# Patient Record
Sex: Male | Born: 1985 | Race: Black or African American | Hispanic: No | Marital: Single | Smoking: Never smoker
Health system: Southern US, Community
[De-identification: ages and names within clinical notes are randomized; demographics above are authoritative.]

## PROBLEM LIST (undated history)

## (undated) DIAGNOSIS — S069X9A Unspecified intracranial injury with loss of consciousness of unspecified duration, initial encounter: Secondary | ICD-10-CM

---

## 2003-10-15 DIAGNOSIS — S069X9A Unspecified intracranial injury with loss of consciousness of unspecified duration, initial encounter: Secondary | ICD-10-CM

## 2003-10-15 HISTORY — DX: Unspecified intracranial injury with loss of consciousness of unspecified duration, initial encounter: S06.9X9A

## 2015-10-15 ENCOUNTER — Encounter (HOSPITAL_COMMUNITY): Payer: Self-pay

## 2015-10-15 ENCOUNTER — Encounter (HOSPITAL_COMMUNITY): Admission: EM | Disposition: A | Discharge status: Home / Self Care | Payer: Self-pay | Source: Home / Self Care

## 2015-10-15 ENCOUNTER — Emergency Department (HOSPITAL_COMMUNITY): Payer: No Typology Code available for payment source

## 2015-10-15 ENCOUNTER — Emergency Department (HOSPITAL_COMMUNITY): Payer: No Typology Code available for payment source | Admitting: Anesthesiology

## 2015-10-15 ENCOUNTER — Inpatient Hospital Stay (HOSPITAL_COMMUNITY)
Admission: EM | Admit: 2015-10-15 | Discharge: 2015-10-21 | DRG: 907 | Disposition: A | Discharge status: Home / Self Care | Payer: No Typology Code available for payment source | Attending: Surgery | Admitting: Surgery

## 2015-10-15 DIAGNOSIS — D62 Acute posthemorrhagic anemia: Secondary | ICD-10-CM | POA: Diagnosis not present

## 2015-10-15 DIAGNOSIS — S15012A Minor laceration of left carotid artery, initial encounter: Secondary | ICD-10-CM | POA: Diagnosis present

## 2015-10-15 DIAGNOSIS — E876 Hypokalemia: Secondary | ICD-10-CM | POA: Diagnosis not present

## 2015-10-15 DIAGNOSIS — R402362 Coma scale, best motor response, obeys commands, at arrival to emergency department: Secondary | ICD-10-CM | POA: Diagnosis present

## 2015-10-15 DIAGNOSIS — R402142 Coma scale, eyes open, spontaneous, at arrival to emergency department: Secondary | ICD-10-CM | POA: Diagnosis present

## 2015-10-15 DIAGNOSIS — J9690 Respiratory failure, unspecified, unspecified whether with hypoxia or hypercapnia: Secondary | ICD-10-CM | POA: Diagnosis present

## 2015-10-15 DIAGNOSIS — S159XXA Injury of unspecified blood vessel at neck level, initial encounter: Secondary | ICD-10-CM

## 2015-10-15 DIAGNOSIS — W3400XA Accidental discharge from unspecified firearms or gun, initial encounter: Secondary | ICD-10-CM

## 2015-10-15 DIAGNOSIS — R1310 Dysphagia, unspecified: Secondary | ICD-10-CM | POA: Diagnosis not present

## 2015-10-15 DIAGNOSIS — S199XXA Unspecified injury of neck, initial encounter: Secondary | ICD-10-CM | POA: Diagnosis present

## 2015-10-15 DIAGNOSIS — Z22322 Carrier or suspected carrier of Methicillin resistant Staphylococcus aureus: Secondary | ICD-10-CM

## 2015-10-15 DIAGNOSIS — R571 Hypovolemic shock: Secondary | ICD-10-CM | POA: Diagnosis present

## 2015-10-15 DIAGNOSIS — S21119A Laceration without foreign body of unspecified front wall of thorax without penetration into thoracic cavity, initial encounter: Secondary | ICD-10-CM | POA: Diagnosis present

## 2015-10-15 DIAGNOSIS — K229 Disease of esophagus, unspecified: Secondary | ICD-10-CM

## 2015-10-15 DIAGNOSIS — S158XXA Injury of other specified blood vessels at neck level, initial encounter: Secondary | ICD-10-CM | POA: Diagnosis present

## 2015-10-15 DIAGNOSIS — Z978 Presence of other specified devices: Secondary | ICD-10-CM

## 2015-10-15 DIAGNOSIS — R402242 Coma scale, best verbal response, confused conversation, at arrival to emergency department: Secondary | ICD-10-CM | POA: Diagnosis present

## 2015-10-15 DIAGNOSIS — S15002A Unspecified injury of left carotid artery, initial encounter: Secondary | ICD-10-CM

## 2015-10-15 DIAGNOSIS — S1191XA Laceration without foreign body of unspecified part of neck, initial encounter: Secondary | ICD-10-CM

## 2015-10-15 DIAGNOSIS — Z419 Encounter for procedure for purposes other than remedying health state, unspecified: Secondary | ICD-10-CM

## 2015-10-15 DIAGNOSIS — R0602 Shortness of breath: Secondary | ICD-10-CM

## 2015-10-15 HISTORY — PX: STERNOTOMY: SHX1057

## 2015-10-15 HISTORY — PX: CHEST EXPLORATION: SHX5104

## 2015-10-15 HISTORY — PX: THYROID EXPLORATION: SHX5280

## 2015-10-15 HISTORY — DX: Unspecified intracranial injury with loss of consciousness of unspecified duration, initial encounter: S06.9X9A

## 2015-10-15 LAB — URINALYSIS, ROUTINE W REFLEX MICROSCOPIC
Bilirubin Urine: NEGATIVE
Glucose, UA: 100 mg/dL — AB
Ketones, ur: 15 mg/dL — AB
Nitrite: NEGATIVE
Protein, ur: >300 mg/dL — AB
Specific Gravity, Urine: 1.029 (ref 1.005–1.030)
pH: 6 (ref 5.0–8.0)

## 2015-10-15 LAB — POCT I-STAT 7, (LYTES, BLD GAS, ICA,H+H)
ACID-BASE DEFICIT: 8 mmol/L — AB (ref 0.0–2.0)
ACID-BASE DEFICIT: 4 mmol/L — AB (ref 0.0–2.0)
Acid-base deficit: 14 mmol/L — ABNORMAL HIGH (ref 0.0–2.0)
Acid-base deficit: 2 mmol/L (ref 0.0–2.0)
BICARBONATE: 21.9 meq/L (ref 20.0–24.0)
BICARBONATE: 23.5 meq/L (ref 20.0–24.0)
Bicarbonate: 15.1 mEq/L — ABNORMAL LOW (ref 20.0–24.0)
Bicarbonate: 18.7 mEq/L — ABNORMAL LOW (ref 20.0–24.0)
Bicarbonate: 24.5 mEq/L — ABNORMAL HIGH (ref 20.0–24.0)
CALCIUM ION: 0.97 mmol/L — AB (ref 1.12–1.23)
CALCIUM ION: 0.71 mmol/L — AB (ref 1.12–1.23)
CALCIUM ION: 1.02 mmol/L — AB (ref 1.12–1.23)
Calcium, Ion: 0.58 mmol/L — CL (ref 1.12–1.23)
Calcium, Ion: 0.96 mmol/L — ABNORMAL LOW (ref 1.12–1.23)
HCT: 27 % — ABNORMAL LOW (ref 39.0–52.0)
HCT: 33 % — ABNORMAL LOW (ref 39.0–52.0)
HCT: 25 % — ABNORMAL LOW (ref 39.0–52.0)
HCT: 27 % — ABNORMAL LOW (ref 39.0–52.0)
HEMATOCRIT: 25 % — AB (ref 39.0–52.0)
HEMOGLOBIN: 11.2 g/dL — AB (ref 13.0–17.0)
HEMOGLOBIN: 9.2 g/dL — AB (ref 13.0–17.0)
HEMOGLOBIN: 8.5 g/dL — AB (ref 13.0–17.0)
Hemoglobin: 8.5 g/dL — ABNORMAL LOW (ref 13.0–17.0)
Hemoglobin: 9.2 g/dL — ABNORMAL LOW (ref 13.0–17.0)
O2 SAT: 100 %
O2 SAT: 100 %
O2 SAT: 100 %
O2 Saturation: 100 %
O2 Saturation: 100 %
PCO2 ART: 44.2 mmHg (ref 35.0–45.0)
PCO2 ART: 42.5 mmHg (ref 35.0–45.0)
PCO2 ART: 38.4 mmHg (ref 35.0–45.0)
PH ART: 7.244 — AB (ref 7.350–7.450)
PH ART: 7.317 — AB (ref 7.350–7.450)
PO2 ART: 552 mmHg — AB (ref 80.0–100.0)
PO2 ART: 557 mmHg — AB (ref 80.0–100.0)
PO2 ART: 527 mmHg — AB (ref 80.0–100.0)
POTASSIUM: 4.9 mmol/L (ref 3.5–5.1)
POTASSIUM: 4.6 mmol/L (ref 3.5–5.1)
Patient temperature: 36.1
Potassium: 5 mmol/L (ref 3.5–5.1)
Potassium: 4.9 mmol/L (ref 3.5–5.1)
Potassium: 3.5 mmol/L (ref 3.5–5.1)
SODIUM: 144 mmol/L (ref 135–145)
SODIUM: 146 mmol/L — AB (ref 135–145)
Sodium: 144 mmol/L (ref 135–145)
Sodium: 145 mmol/L (ref 135–145)
Sodium: 145 mmol/L (ref 135–145)
TCO2: 16 mmol/L (ref 0–100)
TCO2: 20 mmol/L (ref 0–100)
TCO2: 23 mmol/L (ref 0–100)
TCO2: 25 mmol/L (ref 0–100)
TCO2: 26 mmol/L (ref 0–100)
pCO2 arterial: 42.7 mmHg (ref 35.0–45.0)
pCO2 arterial: 42.2 mmHg (ref 35.0–45.0)
pH, Arterial: 7.137 — CL (ref 7.350–7.450)
pH, Arterial: 7.348 — ABNORMAL LOW (ref 7.350–7.450)
pH, Arterial: 7.412 (ref 7.350–7.450)
pO2, Arterial: 553 mmHg — ABNORMAL HIGH (ref 80.0–100.0)
pO2, Arterial: 464 mmHg — ABNORMAL HIGH (ref 80.0–100.0)

## 2015-10-15 LAB — BASIC METABOLIC PANEL
Anion gap: 5 (ref 5–15)
BUN: 10 mg/dL (ref 6–20)
CO2: 25 mmol/L (ref 22–32)
Calcium: 8.1 mg/dL — ABNORMAL LOW (ref 8.9–10.3)
Chloride: 115 mmol/L — ABNORMAL HIGH (ref 101–111)
Creatinine, Ser: 1.13 mg/dL (ref 0.61–1.24)
GFR calc Af Amer: >60 mL/min (ref >60)
GFR calc non Af Amer: >60 mL/min (ref >60)
Glucose, Bld: 134 mg/dL — ABNORMAL HIGH (ref 65–99)
Potassium: 3.7 mmol/L (ref 3.5–5.1)
Sodium: 145 mmol/L (ref 135–145)

## 2015-10-15 LAB — DIC (DISSEMINATED INTRAVASCULAR COAGULATION)PANEL
D-Dimer, Quant: 7.78 ug/mL-FEU — ABNORMAL HIGH (ref 0.00–0.50)
Fibrinogen: 180 mg/dL — ABNORMAL LOW (ref 204–475)
INR: 1.34 (ref 0.00–1.49)
Platelets: 89 10*3/uL — ABNORMAL LOW (ref 150–400)
Prothrombin Time: 16.7 seconds — ABNORMAL HIGH (ref 11.6–15.2)
aPTT: 34 seconds (ref 24–37)

## 2015-10-15 LAB — CBC
HCT: 33.4 % — ABNORMAL LOW (ref 39.0–52.0)
HCT: 31.8 % — ABNORMAL LOW (ref 39.0–52.0)
HCT: 30.5 % — ABNORMAL LOW (ref 39.0–52.0)
HEMATOCRIT: 29.8 % — AB (ref 39.0–52.0)
HEMOGLOBIN: 10.4 g/dL — AB (ref 13.0–17.0)
Hemoglobin: 11.2 g/dL — ABNORMAL LOW (ref 13.0–17.0)
Hemoglobin: 11.7 g/dL — ABNORMAL LOW (ref 13.0–17.0)
Hemoglobin: 11.1 g/dL — ABNORMAL LOW (ref 13.0–17.0)
MCH: 31.5 pg (ref 26.0–34.0)
MCH: 30.5 pg (ref 26.0–34.0)
MCH: 30.9 pg (ref 26.0–34.0)
MCH: 30.6 pg (ref 26.0–34.0)
MCHC: 33.5 g/dL (ref 30.0–36.0)
MCHC: 34.9 g/dL (ref 30.0–36.0)
MCHC: 36.8 g/dL — ABNORMAL HIGH (ref 30.0–36.0)
MCHC: 36.4 g/dL — ABNORMAL HIGH (ref 30.0–36.0)
MCV: 94.1 fL (ref 78.0–100.0)
MCV: 83.9 fL (ref 78.0–100.0)
MCV: 84 fL (ref 78.0–100.0)
PLATELETS: 258 10*3/uL (ref 150–400)
Platelets: 61 10*3/uL — ABNORMAL LOW (ref 150–400)
Platelets: 92 10*3/uL — ABNORMAL LOW (ref 150–400)
Platelets: 71 10*3/uL — ABNORMAL LOW (ref 150–400)
RBC: 3.55 MIL/uL — ABNORMAL LOW (ref 4.22–5.81)
RBC: 3.41 MIL/uL — AB (ref 4.22–5.81)
RBC: 3.79 MIL/uL — ABNORMAL LOW (ref 4.22–5.81)
RBC: 3.63 MIL/uL — ABNORMAL LOW (ref 4.22–5.81)
RDW: 13.5 % (ref 11.5–15.5)
RDW: 14.9 % (ref 11.5–15.5)
RDW: 14.8 % (ref 11.5–15.5)
RDW: 15.3 % (ref 11.5–15.5)
WBC: 14.9 10*3/uL — ABNORMAL HIGH (ref 4.0–10.5)
WBC: 7.8 10*3/uL (ref 4.0–10.5)
WBC: 6.6 10*3/uL (ref 4.0–10.5)
WBC: 7.6 10*3/uL (ref 4.0–10.5)

## 2015-10-15 LAB — POCT I-STAT 3, ART BLOOD GAS (G3+)
Acid-Base Excess: 1 mmol/L (ref 0.0–2.0)
Acid-Base Excess: 2 mmol/L (ref 0.0–2.0)
BICARBONATE: 26.2 meq/L — AB (ref 20.0–24.0)
BICARBONATE: 26.4 meq/L — AB (ref 20.0–24.0)
O2 Saturation: 100 %
O2 Saturation: 99 %
PCO2 ART: 41.8 mmHg (ref 35.0–45.0)
PH ART: 7.419 (ref 7.350–7.450)
PH ART: 7.409 (ref 7.350–7.450)
PO2 ART: 167 mmHg — AB (ref 80.0–100.0)
TCO2: 27 mmol/L (ref 0–100)
TCO2: 28 mmol/L (ref 0–100)
pCO2 arterial: 40.1 mmHg (ref 35.0–45.0)
pO2, Arterial: 122 mmHg — ABNORMAL HIGH (ref 80.0–100.0)

## 2015-10-15 LAB — COMPREHENSIVE METABOLIC PANEL
ALT: 10 U/L — AB (ref 17–63)
ALT: 18 U/L (ref 17–63)
AST: 18 U/L (ref 15–41)
AST: 24 U/L (ref 15–41)
Albumin: 3.1 g/dL — ABNORMAL LOW (ref 3.5–5.0)
Albumin: 2.2 g/dL — ABNORMAL LOW (ref 3.5–5.0)
Alkaline Phosphatase: 41 U/L (ref 38–126)
Alkaline Phosphatase: 35 U/L — ABNORMAL LOW (ref 38–126)
Anion gap: 15 (ref 5–15)
Anion gap: 5 (ref 5–15)
BUN: 10 mg/dL (ref 6–20)
BUN: 7 mg/dL (ref 6–20)
CHLORIDE: 111 mmol/L (ref 101–111)
CO2: 17 mmol/L — AB (ref 22–32)
CO2: 24 mmol/L (ref 22–32)
CREATININE: 1.89 mg/dL — AB (ref 0.61–1.24)
Calcium: 7.8 mg/dL — ABNORMAL LOW (ref 8.9–10.3)
Calcium: 7.6 mg/dL — ABNORMAL LOW (ref 8.9–10.3)
Chloride: 115 mmol/L — ABNORMAL HIGH (ref 101–111)
Creatinine, Ser: 1.03 mg/dL (ref 0.61–1.24)
GFR calc Af Amer: 53 mL/min — ABNORMAL LOW (ref >60)
GFR calc Af Amer: >60 mL/min (ref >60)
GFR calc non Af Amer: >60 mL/min (ref >60)
GFR, EST NON AFRICAN AMERICAN: 46 mL/min — AB (ref >60)
GLUCOSE: 207 mg/dL — AB (ref 65–99)
Glucose, Bld: 146 mg/dL — ABNORMAL HIGH (ref 65–99)
Potassium: 3.5 mmol/L (ref 3.5–5.1)
Potassium: 3.3 mmol/L — ABNORMAL LOW (ref 3.5–5.1)
Sodium: 143 mmol/L (ref 135–145)
Sodium: 144 mmol/L (ref 135–145)
Total Bilirubin: 0.3 mg/dL (ref 0.3–1.2)
Total Bilirubin: 1.5 mg/dL — ABNORMAL HIGH (ref 0.3–1.2)
Total Protein: 5.2 g/dL — ABNORMAL LOW (ref 6.5–8.1)
Total Protein: 4 g/dL — ABNORMAL LOW (ref 6.5–8.1)

## 2015-10-15 LAB — ETHANOL: Alcohol, Ethyl (B): 190 mg/dL — ABNORMAL HIGH (ref <5)

## 2015-10-15 LAB — PROTIME-INR
INR: 1.14 (ref 0.00–1.49)
INR: 1.39 (ref 0.00–1.49)
PROTHROMBIN TIME: 17.1 s — AB (ref 11.6–15.2)
Prothrombin Time: 14.8 seconds (ref 11.6–15.2)

## 2015-10-15 LAB — URINE MICROSCOPIC-ADD ON
Bacteria, UA: NONE SEEN
Squamous Epithelial / LPF: NONE SEEN

## 2015-10-15 LAB — APTT: aPTT: 32 seconds (ref 24–37)

## 2015-10-15 LAB — MRSA PCR SCREENING: MRSA by PCR: POSITIVE — AB

## 2015-10-15 LAB — ABO/RH: ABO/RH(D): A POS

## 2015-10-15 LAB — CDS SEROLOGY

## 2015-10-15 LAB — FIBRINOGEN: FIBRINOGEN: 211 mg/dL (ref 204–475)

## 2015-10-15 SURGERY — EXPLORATION, THYROID
Anesthesia: General

## 2015-10-15 MED ORDER — PHENYLEPHRINE HCL 10 MG/ML IJ SOLN
10.0000 mg | INTRAVENOUS | Status: DC | PRN
  Administered 2015-10-15: 10 ug/min via INTRAVENOUS

## 2015-10-15 MED ORDER — SODIUM CHLORIDE 0.9 % IV SOLN
INTRAVENOUS | Status: DC | PRN
  Administered 2015-10-15 (×9): via INTRAVENOUS

## 2015-10-15 MED ORDER — DEXMEDETOMIDINE HCL IN NACL 200 MCG/50ML IV SOLN: 0.7000 ug/kg/h | INTRAVENOUS | Status: DC

## 2015-10-15 MED ORDER — ROCURONIUM BROMIDE 50 MG/5ML IV SOLN
0.6000 mg/kg | Freq: Once | INTRAVENOUS | Status: DC
  Filled 2015-10-15: qty 5.47

## 2015-10-15 MED ORDER — MIDAZOLAM HCL 2 MG/2ML IJ SOLN
2.0000 mg | INTRAMUSCULAR | Status: AC | PRN
  Administered 2015-10-15 – 2015-10-16 (×3): 2 mg via INTRAVENOUS
  Filled 2015-10-15 (×3): qty 2

## 2015-10-15 MED ORDER — ETOMIDATE 2 MG/ML IV SOLN
INTRAVENOUS | Status: AC | PRN
  Administered 2015-10-15: 20 mg via INTRAVENOUS

## 2015-10-15 MED ORDER — SODIUM CHLORIDE 0.9 % IV SOLN
1.0000 g/h | INTRAVENOUS | Status: DC
  Administered 2015-10-15: 1 g/h via INTRAVENOUS
  Administered 2015-10-15: 5 g/h via INTRAVENOUS
  Filled 2015-10-15 (×2): qty 20

## 2015-10-15 MED ORDER — MIDAZOLAM HCL 2 MG/2ML IJ SOLN
2.0000 mg | INTRAMUSCULAR | Status: DC | PRN
  Administered 2015-10-16 – 2015-10-17 (×2): 2 mg via INTRAVENOUS
  Filled 2015-10-15 (×3): qty 2

## 2015-10-15 MED ORDER — MIDAZOLAM HCL 2 MG/2ML IJ SOLN
INTRAMUSCULAR | Status: AC
  Filled 2015-10-15: qty 2

## 2015-10-15 MED ORDER — BISACODYL 5 MG PO TBEC
10.0000 mg | DELAYED_RELEASE_TABLET | Freq: Every day | ORAL | Status: DC
  Administered 2015-10-17: 10 mg via ORAL
  Filled 2015-10-15 (×3): qty 2

## 2015-10-15 MED ORDER — NITROGLYCERIN IN D5W 200-5 MCG/ML-% IV SOLN
0.0000 ug/min | INTRAVENOUS | Status: DC
  Administered 2015-10-15: 50 ug/min via INTRAVENOUS

## 2015-10-15 MED ORDER — NITROGLYCERIN IN D5W 200-5 MCG/ML-% IV SOLN
INTRAVENOUS | Status: AC
  Filled 2015-10-15: qty 250

## 2015-10-15 MED ORDER — MIDAZOLAM HCL 5 MG/5ML IJ SOLN
INTRAMUSCULAR | Status: DC | PRN
  Administered 2015-10-15: 2 mg via INTRAVENOUS
  Administered 2015-10-15: 4 mg via INTRAVENOUS
  Administered 2015-10-15: 2 mg via INTRAVENOUS

## 2015-10-15 MED ORDER — ROCURONIUM BROMIDE 50 MG/5ML IV SOLN
INTRAVENOUS | Status: AC | PRN
  Administered 2015-10-15: 10 mg via INTRAVENOUS

## 2015-10-15 MED ORDER — MIDAZOLAM HCL 5 MG/5ML IJ SOLN
INTRAMUSCULAR | Status: AC | PRN
  Administered 2015-10-15: 4 mg via INTRAVENOUS

## 2015-10-15 MED ORDER — SODIUM CHLORIDE 0.9 % IV SOLN: 1000.0000 mL | Freq: Once | INTRAVENOUS | Status: DC

## 2015-10-15 MED ORDER — CHLORHEXIDINE GLUCONATE 0.12% ORAL RINSE (MEDLINE KIT)
15.0000 mL | Freq: Two times a day (BID) | OROMUCOSAL | Status: DC
  Administered 2015-10-15 – 2015-10-17 (×5): 15 mL via OROMUCOSAL

## 2015-10-15 MED ORDER — HYDRALAZINE HCL 20 MG/ML IJ SOLN
10.0000 mg | INTRAMUSCULAR | Status: DC | PRN
  Administered 2015-10-15 – 2015-10-16 (×2): 10 mg via INTRAVENOUS
  Filled 2015-10-15 (×2): qty 1

## 2015-10-15 MED ORDER — CHLORHEXIDINE GLUCONATE CLOTH 2 % EX PADS
6.0000 | MEDICATED_PAD | Freq: Every day | CUTANEOUS | Status: AC
  Administered 2015-10-15 – 2015-10-19 (×5): 6 via TOPICAL

## 2015-10-15 MED ORDER — FENTANYL CITRATE (PF) 250 MCG/5ML IJ SOLN
INTRAMUSCULAR | Status: AC
  Filled 2015-10-15: qty 5

## 2015-10-15 MED ORDER — ALBUTEROL SULFATE (2.5 MG/3ML) 0.083% IN NEBU
2.5000 mg | INHALATION_SOLUTION | Freq: Four times a day (QID) | RESPIRATORY_TRACT | Status: DC
  Administered 2015-10-15 – 2015-10-17 (×9): 2.5 mg via RESPIRATORY_TRACT
  Filled 2015-10-15 (×17): qty 3

## 2015-10-15 MED ORDER — METOCLOPRAMIDE HCL 5 MG/ML IJ SOLN
10.0000 mg | Freq: Four times a day (QID) | INTRAMUSCULAR | Status: AC
  Administered 2015-10-15 – 2015-10-20 (×19): 10 mg via INTRAVENOUS
  Filled 2015-10-15 (×19): qty 2

## 2015-10-15 MED ORDER — ACETAMINOPHEN 500 MG PO TABS
1000.0000 mg | ORAL_TABLET | Freq: Four times a day (QID) | ORAL | Status: AC
  Administered 2015-10-16 – 2015-10-19 (×12): 1000 mg via ORAL
  Filled 2015-10-15 (×14): qty 2

## 2015-10-15 MED ORDER — VANCOMYCIN HCL 1000 MG IV SOLR
INTRAVENOUS | Status: AC
  Filled 2015-10-15: qty 1000

## 2015-10-15 MED ORDER — FENTANYL CITRATE (PF) 100 MCG/2ML IJ SOLN
50.0000 ug | Freq: Once | INTRAMUSCULAR | Status: AC
  Administered 2015-10-15: 50 ug via INTRAVENOUS

## 2015-10-15 MED ORDER — DEXMEDETOMIDINE HCL IN NACL 200 MCG/50ML IV SOLN
0.0000 ug/kg/h | INTRAVENOUS | Status: DC
  Administered 2015-10-15 (×2): 0.7 ug/kg/h via INTRAVENOUS
  Filled 2015-10-15: qty 50

## 2015-10-15 MED ORDER — DEXMEDETOMIDINE HCL IN NACL 400 MCG/100ML IV SOLN
0.4000 ug/kg/h | INTRAVENOUS | Status: DC
  Administered 2015-10-15 – 2015-10-16 (×2): 0.7 ug/kg/h via INTRAVENOUS
  Filled 2015-10-15 (×3): qty 100

## 2015-10-15 MED ORDER — ONDANSETRON HCL 4 MG/2ML IJ SOLN
4.0000 mg | Freq: Four times a day (QID) | INTRAMUSCULAR | Status: DC | PRN
  Administered 2015-10-17: 4 mg via INTRAVENOUS
  Filled 2015-10-15: qty 2

## 2015-10-15 MED ORDER — SODIUM CHLORIDE 0.9 % IV SOLN
1000.0000 mL | INTRAVENOUS | Status: DC
Start: 2015-10-15 — End: 2015-10-15

## 2015-10-15 MED ORDER — FENTANYL CITRATE (PF) 100 MCG/2ML IJ SOLN
25.0000 ug | INTRAMUSCULAR | Status: DC | PRN
  Administered 2015-10-16: 25 ug via INTRAVENOUS
  Administered 2015-10-16 – 2015-10-19 (×13): 50 ug via INTRAVENOUS
  Filled 2015-10-15 (×14): qty 2

## 2015-10-15 MED ORDER — DEXMEDETOMIDINE HCL IN NACL 200 MCG/50ML IV SOLN
0.0000 ug/kg/h | INTRAVENOUS | Status: DC
  Administered 2015-10-15: 0.7 ug/kg/h via INTRAVENOUS

## 2015-10-15 MED ORDER — CALCIUM CHLORIDE 10 % IV SOLN
INTRAVENOUS | Status: DC | PRN
  Administered 2015-10-15: 250 mg via INTRAVENOUS
  Administered 2015-10-15: 1000 mg via INTRAVENOUS
  Administered 2015-10-15 (×3): 250 mg via INTRAVENOUS
  Administered 2015-10-15: 500 mg via INTRAVENOUS
  Administered 2015-10-15: 250 mg via INTRAVENOUS

## 2015-10-15 MED ORDER — FENTANYL BOLUS VIA INFUSION
50.0000 ug | INTRAVENOUS | Status: DC | PRN
  Filled 2015-10-15: qty 50

## 2015-10-15 MED ORDER — SUCCINYLCHOLINE CHLORIDE 20 MG/ML IJ SOLN
INTRAMUSCULAR | Status: AC | PRN
  Administered 2015-10-15: 100 mg via INTRAVENOUS

## 2015-10-15 MED ORDER — PHENYLEPHRINE HCL 10 MG/ML IJ SOLN
100.0000 ug/min | INTRAVENOUS | Status: DC
  Filled 2015-10-15: qty 1

## 2015-10-15 MED ORDER — POTASSIUM CHLORIDE 10 MEQ/50ML IV SOLN
10.0000 meq | INTRAVENOUS | Status: AC
  Administered 2015-10-15 (×2): 10 meq via INTRAVENOUS

## 2015-10-15 MED ORDER — DEXTROSE 5 % IV SOLN
1.5000 g | Freq: Two times a day (BID) | INTRAVENOUS | Status: AC
  Administered 2015-10-15 – 2015-10-17 (×4): 1.5 g via INTRAVENOUS
  Filled 2015-10-15 (×4): qty 1.5

## 2015-10-15 MED ORDER — CEFAZOLIN SODIUM-DEXTROSE 2-3 GM-% IV SOLR
INTRAVENOUS | Status: DC | PRN
  Administered 2015-10-15 (×2): 2 g via INTRAVENOUS

## 2015-10-15 MED ORDER — SODIUM BICARBONATE 8.4 % IV SOLN
INTRAVENOUS | Status: DC | PRN
  Administered 2015-10-15 (×2): 50 meq via INTRAVENOUS

## 2015-10-15 MED ORDER — SODIUM CHLORIDE 0.9 % IV SOLN: INTRAVENOUS | Status: DC | PRN

## 2015-10-15 MED ORDER — SODIUM CHLORIDE 0.9 % IV SOLN
INTRAVENOUS | Status: AC | PRN
  Administered 2015-10-15 (×2): 1000 mL via INTRAVENOUS

## 2015-10-15 MED ORDER — HEMOSTATIC AGENTS (NO CHARGE) OPTIME
TOPICAL | Status: DC | PRN
  Administered 2015-10-15 (×2): 1 via TOPICAL

## 2015-10-15 MED ORDER — POTASSIUM CHLORIDE 10 MEQ/50ML IV SOLN
10.0000 meq | Freq: Every day | INTRAVENOUS | Status: DC | PRN
  Administered 2015-10-16 – 2015-10-19 (×11): 10 meq via INTRAVENOUS
  Filled 2015-10-15 (×10): qty 50

## 2015-10-15 MED ORDER — LACTATED RINGERS IV SOLN
INTRAVENOUS | Status: DC | PRN
  Administered 2015-10-15: 03:00:00 via INTRAVENOUS

## 2015-10-15 MED ORDER — SENNOSIDES-DOCUSATE SODIUM 8.6-50 MG PO TABS
1.0000 | ORAL_TABLET | Freq: Every day | ORAL | Status: DC
  Administered 2015-10-15 – 2015-10-20 (×5): 1 via ORAL
  Filled 2015-10-15 (×5): qty 1

## 2015-10-15 MED ORDER — MUPIROCIN 2 % EX OINT
1.0000 "application " | TOPICAL_OINTMENT | Freq: Two times a day (BID) | CUTANEOUS | Status: AC
  Administered 2015-10-15 – 2015-10-19 (×10): 1 via NASAL
  Filled 2015-10-15: qty 22

## 2015-10-15 MED ORDER — SODIUM CHLORIDE 0.9 % IV SOLN
25.0000 ug/h | INTRAVENOUS | Status: DC
  Administered 2015-10-15: 50 ug/h via INTRAVENOUS
  Administered 2015-10-16: 20 ug/h via INTRAVENOUS
  Filled 2015-10-15 (×2): qty 50

## 2015-10-15 MED ORDER — DEXMEDETOMIDINE HCL IN NACL 200 MCG/50ML IV SOLN
INTRAVENOUS | Status: DC | PRN
  Administered 2015-10-15: 0.7 ug/kg/h via INTRAVENOUS

## 2015-10-15 MED ORDER — SODIUM CHLORIDE 0.9 % IV SOLN
20.0000 ug | Freq: Once | INTRAVENOUS | Status: AC
  Administered 2015-10-15: 20 ug via INTRAVENOUS
  Filled 2015-10-15: qty 5

## 2015-10-15 MED ORDER — MIDAZOLAM HCL 2 MG/2ML IJ SOLN
INTRAMUSCULAR | Status: AC
  Filled 2015-10-15: qty 4

## 2015-10-15 MED ORDER — VANCOMYCIN HCL 1000 MG IV SOLR
INTRAVENOUS | Status: DC | PRN
  Administered 2015-10-15: 1000 mg

## 2015-10-15 MED ORDER — FENTANYL CITRATE (PF) 250 MCG/5ML IJ SOLN
INTRAMUSCULAR | Status: DC | PRN
  Administered 2015-10-15: 50 ug via INTRAVENOUS
  Administered 2015-10-15: 500 ug via INTRAVENOUS
  Administered 2015-10-15 (×2): 100 ug via INTRAVENOUS
  Administered 2015-10-15: 50 ug via INTRAVENOUS
  Administered 2015-10-15 (×2): 100 ug via INTRAVENOUS

## 2015-10-15 MED ORDER — SODIUM CHLORIDE 0.9 % IJ SOLN
OROMUCOSAL | Status: DC | PRN
  Administered 2015-10-15: 4 mL via TOPICAL

## 2015-10-15 MED ORDER — VANCOMYCIN HCL IN DEXTROSE 1-5 GM/200ML-% IV SOLN
1000.0000 mg | Freq: Two times a day (BID) | INTRAVENOUS | Status: AC
  Administered 2015-10-15 – 2015-10-16 (×3): 1000 mg via INTRAVENOUS
  Filled 2015-10-15 (×3): qty 200

## 2015-10-15 MED ORDER — ANTISEPTIC ORAL RINSE SOLUTION (CORINZ)
7.0000 mL | Freq: Four times a day (QID) | OROMUCOSAL | Status: DC
  Administered 2015-10-15 – 2015-10-17 (×6): 7 mL via OROMUCOSAL

## 2015-10-15 MED ORDER — POTASSIUM CHLORIDE 10 MEQ/50ML IV SOLN
10.0000 meq | INTRAVENOUS | Status: AC
  Administered 2015-10-15 (×2): 10 meq via INTRAVENOUS
  Filled 2015-10-15: qty 50

## 2015-10-15 MED ORDER — DEXTROSE-NACL 5-0.9 % IV SOLN
INTRAVENOUS | Status: DC
  Administered 2015-10-15: 100 mL/h via INTRAVENOUS
  Administered 2015-10-15: 09:00:00 via INTRAVENOUS

## 2015-10-15 MED ORDER — ROCURONIUM BROMIDE 50 MG/5ML IV SOLN
INTRAVENOUS | Status: AC
  Filled 2015-10-15: qty 1

## 2015-10-15 MED ORDER — ACETAMINOPHEN 160 MG/5ML PO SOLN
1000.0000 mg | Freq: Four times a day (QID) | ORAL | Status: AC
  Administered 2015-10-15 – 2015-10-16 (×4): 1000 mg via ORAL
  Filled 2015-10-15 (×4): qty 40.6

## 2015-10-15 MED ORDER — ROCURONIUM BROMIDE 100 MG/10ML IV SOLN
INTRAVENOUS | Status: DC | PRN
  Administered 2015-10-15: 50 mg via INTRAVENOUS
  Administered 2015-10-15: 100 mg via INTRAVENOUS
  Administered 2015-10-15 (×2): 50 mg via INTRAVENOUS

## 2015-10-15 SURGICAL SUPPLY — 60 items
BLADE STERNUM SYSTEM 6 (BLADE) ×3 IMPLANT
BLADE SURG ROTATE 9660 (MISCELLANEOUS) ×6 IMPLANT
CANISTER SUCTION 2500CC (MISCELLANEOUS) ×6 IMPLANT
CLIP TI MEDIUM 24 (CLIP) ×6 IMPLANT
CLIP TI WIDE RED SMALL 24 (CLIP) ×3 IMPLANT
CONNECTOR 5 IN 1 STRAIGHT STRL (MISCELLANEOUS) ×3 IMPLANT
COVER BACK TABLE 60X90IN (DRAPES) ×3 IMPLANT
COVER SURGICAL LIGHT HANDLE (MISCELLANEOUS) ×3 IMPLANT
DRAIN CHANNEL 15F RND FF W/TCR (WOUND CARE) ×3 IMPLANT
DRAIN CHANNEL 32F RND 10.7 FF (WOUND CARE) ×3 IMPLANT
DRSG AQUACEL AG ADV 3.5X14 (GAUZE/BANDAGES/DRESSINGS) ×3 IMPLANT
DRSG COVADERM 4X6 (GAUZE/BANDAGES/DRESSINGS) ×3 IMPLANT
ELECT BLADE 4.0 EZ CLEAN MEGAD (MISCELLANEOUS) ×3
ELECTRODE BLDE 4.0 EZ CLN MEGD (MISCELLANEOUS) ×2 IMPLANT
EVACUATOR SILICONE 100CC (DRAIN) ×3 IMPLANT
GAUZE SPONGE 4X4 12PLY STRL (GAUZE/BANDAGES/DRESSINGS) ×3 IMPLANT
GAUZE XEROFORM 5X9 LF (GAUZE/BANDAGES/DRESSINGS) ×3 IMPLANT
GEL ULTRASOUND 20GR AQUASONIC (MISCELLANEOUS) ×3 IMPLANT
GLOVE BIO SURGEON STRL SZ7 (GLOVE) ×3 IMPLANT
GLOVE BIO SURGEON STRL SZ7.5 (GLOVE) ×9 IMPLANT
GOWN STRL REUS W/ TWL LRG LVL3 (GOWN DISPOSABLE) ×8 IMPLANT
GOWN STRL REUS W/ TWL XL LVL3 (GOWN DISPOSABLE) ×6 IMPLANT
GOWN STRL REUS W/TWL LRG LVL3 (GOWN DISPOSABLE) ×4
GOWN STRL REUS W/TWL XL LVL3 (GOWN DISPOSABLE) ×3
HEMOSTAT SURGICEL 2X14 (HEMOSTASIS) ×3 IMPLANT
KIT BASIN OR (CUSTOM PROCEDURE TRAY) ×3 IMPLANT
KIT ROOM TURNOVER OR (KITS) ×3 IMPLANT
NEEDLE 18GX1X1/2 (RX/OR ONLY) (NEEDLE) ×3 IMPLANT
NS IRRIG 1000ML POUR BTL (IV SOLUTION) ×3 IMPLANT
PACK CAROTID (CUSTOM PROCEDURE TRAY) ×3 IMPLANT
PAD ARMBOARD 7.5X6 YLW CONV (MISCELLANEOUS) ×6 IMPLANT
SPONGE LAP 4X18 X RAY DECT (DISPOSABLE) ×3 IMPLANT
STAPLER VISISTAT (STAPLE) ×3 IMPLANT
SURGIFLO W/THROMBIN 8M KIT (HEMOSTASIS) ×3 IMPLANT
SUT ETHILON 4 0 PS 2 18 (SUTURE) ×3 IMPLANT
SUT PROLENE 3 0 SH 48 (SUTURE) ×6 IMPLANT
SUT PROLENE 4 0 RB 1 (SUTURE) ×3
SUT PROLENE 4-0 RB1 .5 CRCL 36 (SUTURE) ×6 IMPLANT
SUT PROLENE 5 0 C 1 24 (SUTURE) ×3 IMPLANT
SUT PROLENE 5 0 C1 (SUTURE) ×6 IMPLANT
SUT PROLENE 6 0 BV (SUTURE) ×6 IMPLANT
SUT PROLENE 6 0 C 1 30 (SUTURE) ×18 IMPLANT
SUT PROLENE 6 0 CC (SUTURE) ×6 IMPLANT
SUT SILK  1 MH (SUTURE) ×2
SUT SILK 1 MH (SUTURE) ×4 IMPLANT
SUT SILK 2 0 SH CR/8 (SUTURE) ×3 IMPLANT
SUT VIC AB 1 CTX 18 (SUTURE) ×6 IMPLANT
SUT VIC AB 1 CTX 27 (SUTURE) ×6 IMPLANT
SUT VIC AB 2-0 CTX 27 (SUTURE) ×3 IMPLANT
SUT VIC AB 3-0 SH 27 (SUTURE) ×2
SUT VIC AB 3-0 SH 27X BRD (SUTURE) ×4 IMPLANT
SUT VIC AB 3-0 X1 27 (SUTURE) ×3 IMPLANT
SUT VICRYL 4-0 PS2 18IN ABS (SUTURE) ×3 IMPLANT
SYR 20CC LL (SYRINGE) ×3 IMPLANT
TAPE CLOTH SURG 4X10 WHT LF (GAUZE/BANDAGES/DRESSINGS) ×3 IMPLANT
TOWEL OR 17X24 6PK STRL BLUE (TOWEL DISPOSABLE) ×3 IMPLANT
TOWEL OR 17X26 10 PK STRL BLUE (TOWEL DISPOSABLE) ×3 IMPLANT
TUBE CONNECTING 12X1/4 (SUCTIONS) ×3 IMPLANT
WATER STERILE IRR 1000ML POUR (IV SOLUTION) ×3 IMPLANT
YANKAUER SUCT BULB TIP NO VENT (SUCTIONS) ×12 IMPLANT
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
## 2015-10-15 NOTE — Transfer of Care (Signed)
Immediate Anesthesia Transfer of Care Note  Patient: Austin Campos  Procedure(s) Performed: Procedure(s): LEFT NECK EXPLORATION (Left) STERNOTOMY (N/A) CHEST EXPLORATION WITH REPAIR OF LEFT INTERANL CAROTID ARTERY (N/A)  Patient Location: ICU  Anesthesia Type:General  Level of Consciousness: sedated, unresponsive and Patient remains intubated per anesthesia plan  Airway & Oxygen Therapy: Patient remains intubated per anesthesia plan and Patient placed on Ventilator (see vital sign flow sheet for setting)  Post-op Assessment: Report given to RN and Post -op Vital signs reviewed and stable  Post vital signs: Reviewed and stable  Last Vitals:  Filed Vitals:   10/15/15 0248 10/15/15 0252  BP: 128/85 96/80  Pulse: 124 106  Resp: 16 18    Complications: No apparent anesthesia complications

## 2015-10-15 NOTE — ED Notes (Signed)
1st unit emergency released blood going at 999 ml/hour

## 2015-10-15 NOTE — Progress Notes (Signed)
Platelets 71, notified Dr. Lindie SpruceWyatt, no new orders, will recheck labs in the am.  Hermina BartersBOWMAN, Raha Tennison M, RN

## 2015-10-15 NOTE — Progress Notes (Signed)
Potassium 3.3, notified [name], gave verbal order to give patient two runs of K, keep systolic BP <130.  Hermina BartersBOWMAN, Afomia Blackley M, RN

## 2015-10-15 NOTE — Progress Notes (Signed)
CT surgery p.m. Rounds  Patient hemodynamic stable Patient became more conscious and moved all extremities Minimal output from mediastinal drain or left neck JP drain Platelet count this evening 71,000 Hemoglobin stable 11 g We'll plan on weaning vent and extubation in a.m. Nasal swab positive for MRSA-isolation protocol started
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
## 2015-10-15 NOTE — ED Notes (Signed)
Pt incontinent of bowel at this time

## 2015-10-15 NOTE — Anesthesia Postprocedure Evaluation (Signed)
Anesthesia Post Note  Patient: Austin Campos  Procedure(s) Performed: Procedure(s) (LRB): LEFT NECK EXPLORATION (Left) STERNOTOMY (N/A) CHEST EXPLORATION WITH REPAIR OF LEFT INTERANL CAROTID ARTERY (N/A)  Patient location during evaluation: ICU Anesthesia Type: General Level of consciousness: sedated and patient remains intubated per anesthesia plan Vital Signs Assessment: post-procedure vital signs reviewed and stable Respiratory status: patient on ventilator - see flowsheet for VS and patient remains intubated per anesthesia plan Cardiovascular status: stable Anesthetic complications: no    Last Vitals:  Filed Vitals:   10/15/15 0248 10/15/15 0252  BP: 128/85 96/80  Pulse: 124 106  Resp: 16 18    Last Pain: There were no vitals filed for this visit.               Alanda AmassFRIEDMAN,Janalynn Eder
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
## 2015-10-16 ENCOUNTER — Encounter (HOSPITAL_COMMUNITY): Payer: Self-pay

## 2015-10-16 ENCOUNTER — Inpatient Hospital Stay (HOSPITAL_COMMUNITY): Payer: No Typology Code available for payment source

## 2015-10-16 LAB — PREPARE FRESH FROZEN PLASMA
UNIT DIVISION: 0
UNIT DIVISION: 0
UNIT DIVISION: 0
UNIT DIVISION: 0
UNIT DIVISION: 0
UNIT DIVISION: 0
UNIT DIVISION: 0
UNIT DIVISION: 0
UNIT DIVISION: 0
UNIT DIVISION: 0
UNIT DIVISION: 0
UNIT DIVISION: 0
Unit division: 0
Unit division: 0
Unit division: 0
Unit division: 0
Unit division: 0
Unit division: 0
Unit division: 0
Unit division: 0
Unit division: 0
Unit division: 0
Unit division: 0
Unit division: 0

## 2015-10-16 LAB — POCT I-STAT, CHEM 8
BUN: 4 mg/dL — AB (ref 6–20)
CALCIUM ION: 1.18 mmol/L (ref 1.12–1.23)
CHLORIDE: 102 mmol/L (ref 101–111)
Creatinine, Ser: 0.8 mg/dL (ref 0.61–1.24)
GLUCOSE: 122 mg/dL — AB (ref 65–99)
HCT: 29 % — ABNORMAL LOW (ref 39.0–52.0)
Hemoglobin: 9.9 g/dL — ABNORMAL LOW (ref 13.0–17.0)
Potassium: 3.3 mmol/L — ABNORMAL LOW (ref 3.5–5.1)
SODIUM: 140 mmol/L (ref 135–145)
TCO2: 25 mmol/L (ref 0–100)

## 2015-10-16 LAB — POCT I-STAT 3, ART BLOOD GAS (G3+)
ACID-BASE DEFICIT: 1 mmol/L (ref 0.0–2.0)
Bicarbonate: 25 mEq/L — ABNORMAL HIGH (ref 20.0–24.0)
Bicarbonate: 24.7 mEq/L — ABNORMAL HIGH (ref 20.0–24.0)
Bicarbonate: 25.1 mEq/L — ABNORMAL HIGH (ref 20.0–24.0)
O2 SAT: 92 %
O2 Saturation: 98 %
O2 Saturation: 97 %
PCO2 ART: 43.8 mmHg (ref 35.0–45.0)
PH ART: 7.374 (ref 7.350–7.450)
PH ART: 7.366 (ref 7.350–7.450)
PO2 ART: 109 mmHg — AB (ref 80.0–100.0)
PO2 ART: 95 mmHg (ref 80.0–100.0)
Patient temperature: 98
TCO2: 26 mmol/L (ref 0–100)
TCO2: 26 mmol/L (ref 0–100)
TCO2: 26 mmol/L (ref 0–100)
pCO2 arterial: 40.3 mmHg (ref 35.0–45.0)
pCO2 arterial: 42.3 mmHg (ref 35.0–45.0)
pH, Arterial: 7.398 (ref 7.350–7.450)
pO2, Arterial: 63 mmHg — ABNORMAL LOW (ref 80.0–100.0)

## 2015-10-16 LAB — PREPARE PLATELET PHERESIS
UNIT DIVISION: 0
UNIT DIVISION: 0
Unit division: 0

## 2015-10-16 LAB — CBC
HCT: 30.9 % — ABNORMAL LOW (ref 39.0–52.0)
HCT: 29.8 % — ABNORMAL LOW (ref 39.0–52.0)
Hemoglobin: 10.9 g/dL — ABNORMAL LOW (ref 13.0–17.0)
Hemoglobin: 10.7 g/dL — ABNORMAL LOW (ref 13.0–17.0)
MCH: 29.5 pg (ref 26.0–34.0)
MCH: 30.6 pg (ref 26.0–34.0)
MCHC: 35.3 g/dL (ref 30.0–36.0)
MCHC: 35.9 g/dL (ref 30.0–36.0)
MCV: 83.7 fL (ref 78.0–100.0)
MCV: 85.1 fL (ref 78.0–100.0)
Platelets: 79 10*3/uL — ABNORMAL LOW (ref 150–400)
Platelets: 81 10*3/uL — ABNORMAL LOW (ref 150–400)
RBC: 3.69 MIL/uL — ABNORMAL LOW (ref 4.22–5.81)
RBC: 3.5 MIL/uL — ABNORMAL LOW (ref 4.22–5.81)
RDW: 15.7 % — ABNORMAL HIGH (ref 11.5–15.5)
RDW: 15.8 % — ABNORMAL HIGH (ref 11.5–15.5)
WBC: 10.7 10*3/uL — ABNORMAL HIGH (ref 4.0–10.5)
WBC: 13.4 10*3/uL — ABNORMAL HIGH (ref 4.0–10.5)

## 2015-10-16 LAB — PREPARE CRYOPRECIPITATE
UNIT DIVISION: 0
Unit division: 0

## 2015-10-16 LAB — COMPREHENSIVE METABOLIC PANEL
ALT: 15 U/L — ABNORMAL LOW (ref 17–63)
AST: 31 U/L (ref 15–41)
Albumin: 2.5 g/dL — ABNORMAL LOW (ref 3.5–5.0)
Alkaline Phosphatase: 41 U/L (ref 38–126)
Anion gap: 8 (ref 5–15)
BUN: 9 mg/dL (ref 6–20)
CO2: 25 mmol/L (ref 22–32)
Calcium: 8 mg/dL — ABNORMAL LOW (ref 8.9–10.3)
Chloride: 109 mmol/L (ref 101–111)
Creatinine, Ser: 1.01 mg/dL (ref 0.61–1.24)
GFR calc Af Amer: >60 mL/min (ref >60)
GFR calc non Af Amer: >60 mL/min (ref >60)
Glucose, Bld: 142 mg/dL — ABNORMAL HIGH (ref 65–99)
Potassium: 3.2 mmol/L — ABNORMAL LOW (ref 3.5–5.1)
Sodium: 142 mmol/L (ref 135–145)
Total Bilirubin: 1.4 mg/dL — ABNORMAL HIGH (ref 0.3–1.2)
Total Protein: 4.6 g/dL — ABNORMAL LOW (ref 6.5–8.1)

## 2015-10-16 LAB — POCT I-STAT 4, (NA,K, GLUC, HGB,HCT)
GLUCOSE: 138 mg/dL — AB (ref 65–99)
HEMATOCRIT: 30 % — AB (ref 39.0–52.0)
Hemoglobin: 10.2 g/dL — ABNORMAL LOW (ref 13.0–17.0)
Potassium: 3.1 mmol/L — ABNORMAL LOW (ref 3.5–5.1)
Sodium: 142 mmol/L (ref 135–145)

## 2015-10-16 LAB — BLOOD PRODUCT ORDER (VERBAL) VERIFICATION

## 2015-10-16 MED ORDER — SODIUM CHLORIDE 0.9 % IJ SOLN
10.0000 mL | Freq: Two times a day (BID) | INTRAMUSCULAR | Status: DC
  Administered 2015-10-16 – 2015-10-20 (×6): 10 mL

## 2015-10-16 MED ORDER — FENTANYL BOLUS VIA INFUSION
50.0000 ug | INTRAVENOUS | Status: DC | PRN
  Administered 2015-10-16: 50 ug via INTRAVENOUS
  Filled 2015-10-16: qty 50

## 2015-10-16 MED ORDER — ASPIRIN 300 MG RE SUPP
150.0000 mg | Freq: Every day | RECTAL | Status: DC
  Administered 2015-10-16: 150 mg via RECTAL
  Filled 2015-10-16 (×2): qty 1

## 2015-10-16 MED ORDER — PNEUMOCOCCAL VAC POLYVALENT 25 MCG/0.5ML IJ INJ: 0.5000 mL | INJECTION | INTRAMUSCULAR | Status: DC | PRN

## 2015-10-16 MED ORDER — INFLUENZA VAC SPLIT QUAD 0.5 ML IM SUSY: 0.5000 mL | PREFILLED_SYRINGE | INTRAMUSCULAR | Status: DC | PRN

## 2015-10-16 MED ORDER — POTASSIUM CHLORIDE 10 MEQ/50ML IV SOLN
INTRAVENOUS | Status: AC
  Administered 2015-10-16: 10 meq via INTRAVENOUS
  Filled 2015-10-16: qty 50

## 2015-10-16 MED ORDER — KETOROLAC TROMETHAMINE 15 MG/ML IJ SOLN
15.0000 mg | Freq: Four times a day (QID) | INTRAMUSCULAR | Status: AC
  Administered 2015-10-16 – 2015-10-18 (×8): 15 mg via INTRAVENOUS
  Filled 2015-10-16 (×8): qty 1

## 2015-10-16 MED ORDER — KCL IN DEXTROSE-NACL 20-5-0.45 MEQ/L-%-% IV SOLN
INTRAVENOUS | Status: DC
  Administered 2015-10-16: 09:00:00 via INTRAVENOUS
  Administered 2015-10-16 – 2015-10-17 (×2): 75 mL/h via INTRAVENOUS
  Administered 2015-10-20: 21:00:00 via INTRAVENOUS
  Filled 2015-10-16 (×5): qty 1000

## 2015-10-16 MED ORDER — METOPROLOL TARTRATE 1 MG/ML IV SOLN
5.0000 mg | Freq: Four times a day (QID) | INTRAVENOUS | Status: DC
  Administered 2015-10-16 – 2015-10-21 (×19): 5 mg via INTRAVENOUS
  Filled 2015-10-16 (×17): qty 5

## 2015-10-16 MED ORDER — SODIUM CHLORIDE 0.9 % IV SOLN
25.0000 ug/h | INTRAVENOUS | Status: DC
  Filled 2015-10-16: qty 50

## 2015-10-16 MED ORDER — ROCURONIUM BROMIDE 50 MG/5ML IV SOLN
0.1000 mg/kg | Freq: Once | INTRAVENOUS | Status: AC
  Administered 2015-10-16: 9.1 mg via INTRAVENOUS
  Filled 2015-10-16: qty 0.91

## 2015-10-16 MED ORDER — PANTOPRAZOLE SODIUM 40 MG IV SOLR
40.0000 mg | Freq: Two times a day (BID) | INTRAVENOUS | Status: DC
  Administered 2015-10-16 – 2015-10-18 (×6): 40 mg via INTRAVENOUS
  Filled 2015-10-16 (×6): qty 40

## 2015-10-16 MED ORDER — METOPROLOL TARTRATE 1 MG/ML IV SOLN
5.0000 mg | INTRAVENOUS | Status: DC | PRN
  Administered 2015-10-17: 5 mg via INTRAVENOUS
  Filled 2015-10-16 (×3): qty 5

## 2015-10-16 MED ORDER — FUROSEMIDE 10 MG/ML IJ SOLN
20.0000 mg | Freq: Once | INTRAMUSCULAR | Status: AC
  Administered 2015-10-16: 20 mg via INTRAVENOUS
  Filled 2015-10-16: qty 2

## 2015-10-16 MED ORDER — POTASSIUM CHLORIDE 10 MEQ/100ML IV SOLN
10.0000 meq | INTRAVENOUS | Status: AC
  Administered 2015-10-16 (×2): 10 meq via INTRAVENOUS

## 2015-10-16 MED ORDER — OXYCODONE HCL 5 MG PO TABS
10.0000 mg | ORAL_TABLET | ORAL | Status: DC | PRN
  Administered 2015-10-16 – 2015-10-21 (×25): 10 mg via ORAL
  Filled 2015-10-16 (×25): qty 2

## 2015-10-16 MED ORDER — CLONIDINE HCL 0.2 MG/24HR TD PTWK
0.2000 mg | MEDICATED_PATCH | TRANSDERMAL | Status: DC
  Administered 2015-10-16: 0.2 mg via TRANSDERMAL
  Filled 2015-10-16: qty 1

## 2015-10-16 MED ORDER — SODIUM CHLORIDE 0.9 % IJ SOLN
10.0000 mL | INTRAMUSCULAR | Status: DC | PRN
Start: 2015-10-16 — End: 2015-10-21

## 2015-10-16 MED ORDER — PANTOPRAZOLE SODIUM 40 MG IV SOLR
40.0000 mg | Freq: Every day | INTRAVENOUS | Status: DC
  Administered 2015-10-16: 40 mg via INTRAVENOUS
  Filled 2015-10-16: qty 40

## 2015-10-16 NOTE — Progress Notes (Signed)
1 Day Post-Op Procedure(s) (LRB): LEFT NECK EXPLORATION (Left) STERNOTOMY (N/A) CHEST EXPLORATION WITH REPAIR OF LEFT INTERANL CAROTID ARTERY (N/A) Subjective: Remains intubated and sedated Became combative last pm with bleeding from neck drain site- now resolved, Hb stable  Objective: Vital signs in last 24 hours: Temp:  [97.4 F (36.3 C)-100.4 F (38 C)] 98.7 F (37.1 C) (01/02 0728) Pulse Rate:  [79-105] 96 (01/02 0800) Cardiac Rhythm:  [-] Normal sinus rhythm (01/02 0800) Resp:  [0-35] 14 (01/02 0800) BP: (90-129)/(46-90) 99/65 mmHg (01/02 0800) SpO2:  [94 %-100 %] 100 % (01/02 0800) Arterial Line BP: (93-161)/(44-95) 113/54 mmHg (01/02 0800) FiO2 (%):  [30 %-50 %] 40 % (01/02 0800) Weight:  [201 lb 1 oz (91.2 kg)] 201 lb 1 oz (91.2 kg) (01/01 1208)  Hemodynamic parameters for last 24 hours:  nsr  Intake/Output from previous day: 01/01 0701 - 01/02 0700 In: 5849.9 [I.V.:4514.9; Blood:335; NG/GT:350; IV Piggyback:650] Out: 3430 [Urine:2270; Emesis/NG output:750; Drains:110; Chest Tube:300] Intake/Output this shift: Total I/O In: 151.7 [I.V.:151.7] Out: 85 [Urine:85]  Lungs clear No hematoma of neck No airleak from chest tube  Lab Results:  Recent Labs  10/15/15 1500 10/16/15 0208 10/16/15 0237  WBC 7.6  --  10.7*  HGB 11.1* 10.2* 10.9*  HCT 30.5* 30.0* 30.9*  PLT 71*  --  79*   BMET:  Recent Labs  10/15/15 1500 10/16/15 0208 10/16/15 0237  NA 145 142 142  K 3.7 3.1* 3.2*  CL 115*  --  109  CO2 25  --  25  GLUCOSE 134* 138* 142*  BUN 10  --  9  CREATININE 1.13  --  1.01  CALCIUM 8.1*  --  8.0*    PT/INR:  Recent Labs  10/15/15 1000  LABPROT 16.7*  INR 1.34   ABG    Component Value Date/Time   PHART 7.398 10/16/2015 0224   HCO3 25.0* 10/16/2015 0224   TCO2 26 10/16/2015 0224   ACIDBASEDEF 2.0 10/15/2015 0516   O2SAT 92.0 10/16/2015 0224   CBG (last 3)  No results for input(s): GLUCAP in the last 72 hours.  Assessment/Plan: S/P  Procedure(s) (LRB): LEFT NECK EXPLORATION (Left) STERNOTOMY (N/A) CHEST EXPLORATION WITH REPAIR OF LEFT INTERANL CAROTID ARTERY (N/A) wean vent and extubate  Keep npo except meds Leave both neck and chest drains today   LOS: 1 day    Austin Campos 10/16/2015

## 2015-10-16 NOTE — Progress Notes (Signed)
CT surgery p.m. Rounds  Patient extubated early this afternoon Maintaining adequate O2 saturation Minimal drainage from neck and chest drains Tachycardic with elevated blood pressure-we'll start on metoprolol, clonidine, and prn IV Apresoline Patient having difficulty swallowing so will give medicines IV dosing and have speech therapy assessment bedside swallow function tomorrow-keep nothing by mouth except for ice chips or pills

## 2015-10-16 NOTE — Progress Notes (Signed)
Pt extubated at 12:24 by RT following the rapid wean protocol per Dr Donata ClayVan Trigt, tolerated well. Now on 4L oxygen via nasal cannula. Femoral line pulled at 12:30, pt lying flat. Held pressure over site for 5 minutes, no bleeding noted. No bleeding noted at 12:45 or at 13:00. Pt alert and oriented, no complaints. Will continue to monitor.   Peyton Bottomsachel R Taniaya Rudder, RN 1:05 PM 10/16/2015
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
## 2015-10-16 NOTE — Progress Notes (Signed)
Pt became very agitated and unable to calm down presenting as if in respiratory distress. RT called to bedside, pt suctioned and given prn versed. Afterwards noticed pt was bleeding from left neck dressing and 300ml of bloody drainage from OGT. Pressure applied to neck to stop bleeding. Dr. Lindie SpruceWyatt and [name] notified. Increased fentanyl to 27800mcg/hr, irrigated OGT with ice cold water until clear drainage, placed liter bag over neck site, and given prn paralytic per [name]. Dressing changed to left neck when bleeding stopped. Staples intact, not active bleeding noted at this time

## 2015-10-16 NOTE — Clinical Documentation Improvement (Signed)
Trauma  The medical record includes the following unapproved, unclear or unknown abbreviation: ARF Please clarify>  [facility] to [facility] approved abbreviations ARF- Acute Renal Failure      Progress note on 10/16/15 : "ARF -- Wean and extubate this am"   Please document the associated medical diagnosis related to the above abbreviation.   Please exercise your independent, professional judgment when responding. A specific answer is not anticipated or expected. Please update your documentation within the medical record to reflect your response to this query. Thank you  Thank Barrie DunkerYou, Bryssa Tones C Harlym Gehling Health Information Management [place] [phone]

## 2015-10-16 NOTE — Plan of Care (Signed)
Wasted 200 cc fentanyl in sink, witnessed by [employer] Paperachael RN
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
## 2015-10-16 NOTE — Procedures (Signed)
Extubation Procedure Note  Patient Details:   Name: Austin Campos DOB: 02/17/1986 MRN: [id number]   Airway Documentation:     Evaluation  O2 sats: stable throughout Complications: No apparent complications Patient did tolerate procedure well. Bilateral Breath Sounds: Clear Suctioning: Oral, Airway Yes  Pt extubated to 4L [name].  No stridor noted.  RN at bedside.  [name] 10/16/2015, 12:26 PM
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
## 2015-10-16 NOTE — Progress Notes (Signed)
Peripherally Inserted Central Catheter/Midline Placement  The IV Nurse has discussed with the patient and/or persons authorized to consent for the patient, the purpose of this procedure and the potential benefits and risks involved with this procedure.  The benefits include less needle sticks, lab draws from the catheter and patient may be discharged home with the catheter.  Risks include, but not limited to, infection, bleeding, blood clot (thrombus formation), and puncture of an artery; nerve damage and irregular heat beat.  Alternatives to this procedure were also discussed.  PICC/Midline Placement Documentation  PICC Triple Lumen 10/16/15 PICC Left Basilic 48 cm 3 cm (Active)  Indication for Insertion or Continuance of Line Prolonged intravenous therapies 10/16/2015  9:00 AM  Exposed Catheter (cm) 3 cm 10/16/2015  9:00 AM  Dressing Change Due 10/23/15 10/16/2015  9:00 AM    Telephone consent   Stacie GlazeJoyce, Tylan Kinn Horton 10/16/2015, 10:04 AM

## 2015-10-16 NOTE — Progress Notes (Signed)
Pt dangled and stood at bedside, no adverse effects. Tolerated well. Pt now back in bed.  Peyton Bottomsachel R Breon Diss, RN 3:47 PM 10/16/2015

## 2015-10-17 ENCOUNTER — Inpatient Hospital Stay (HOSPITAL_COMMUNITY): Payer: No Typology Code available for payment source

## 2015-10-17 LAB — POCT I-STAT 3, ART BLOOD GAS (G3+)
ACID-BASE EXCESS: 2 mmol/L (ref 0.0–2.0)
Bicarbonate: 25.1 mEq/L — ABNORMAL HIGH (ref 20.0–24.0)
O2 Saturation: 91 %
PO2 ART: 59 mmHg — AB (ref 80.0–100.0)
TCO2: 26 mmol/L (ref 0–100)
pCO2 arterial: 35.2 mmHg (ref 35.0–45.0)
pH, Arterial: 7.462 — ABNORMAL HIGH (ref 7.350–7.450)

## 2015-10-17 LAB — COMPREHENSIVE METABOLIC PANEL
ALT: 17 U/L (ref 17–63)
AST: 44 U/L — ABNORMAL HIGH (ref 15–41)
Albumin: 2.4 g/dL — ABNORMAL LOW (ref 3.5–5.0)
Alkaline Phosphatase: 58 U/L (ref 38–126)
Anion gap: 7 (ref 5–15)
BUN: <5 mg/dL — ABNORMAL LOW (ref 6–20)
CO2: 28 mmol/L (ref 22–32)
Calcium: 8.4 mg/dL — ABNORMAL LOW (ref 8.9–10.3)
Chloride: 102 mmol/L (ref 101–111)
Creatinine, Ser: 0.74 mg/dL (ref 0.61–1.24)
GFR calc Af Amer: >60 mL/min (ref >60)
GFR calc non Af Amer: >60 mL/min (ref >60)
Glucose, Bld: 116 mg/dL — ABNORMAL HIGH (ref 65–99)
Potassium: 3.2 mmol/L — ABNORMAL LOW (ref 3.5–5.1)
Sodium: 137 mmol/L (ref 135–145)
Total Bilirubin: 1 mg/dL (ref 0.3–1.2)
Total Protein: 5.2 g/dL — ABNORMAL LOW (ref 6.5–8.1)

## 2015-10-17 LAB — CBC
HCT: 31.5 % — ABNORMAL LOW (ref 39.0–52.0)
Hemoglobin: 10.8 g/dL — ABNORMAL LOW (ref 13.0–17.0)
MCH: 29.3 pg (ref 26.0–34.0)
MCHC: 34.3 g/dL (ref 30.0–36.0)
MCV: 85.6 fL (ref 78.0–100.0)
Platelets: 109 10*3/uL — ABNORMAL LOW (ref 150–400)
RBC: 3.68 MIL/uL — ABNORMAL LOW (ref 4.22–5.81)
RDW: 15.3 % (ref 11.5–15.5)
WBC: 11.8 10*3/uL — ABNORMAL HIGH (ref 4.0–10.5)

## 2015-10-17 LAB — POCT I-STAT, CHEM 8
BUN: <3 mg/dL — ABNORMAL LOW (ref 6–20)
Calcium, Ion: 1.1 mmol/L — ABNORMAL LOW (ref 1.12–1.23)
Chloride: 98 mmol/L — ABNORMAL LOW (ref 101–111)
Creatinine, Ser: 0.8 mg/dL (ref 0.61–1.24)
Glucose, Bld: 163 mg/dL — ABNORMAL HIGH (ref 65–99)
HEMATOCRIT: 33 % — AB (ref 39.0–52.0)
HEMOGLOBIN: 11.2 g/dL — AB (ref 13.0–17.0)
Potassium: 3.5 mmol/L (ref 3.5–5.1)
SODIUM: 139 mmol/L (ref 135–145)
TCO2: 25 mmol/L (ref 0–100)

## 2015-10-17 MED ORDER — IOHEXOL 300 MG/ML  SOLN
150.0000 mL | Freq: Once | INTRAMUSCULAR | Status: AC | PRN
  Administered 2015-10-17: 50 mL via ORAL

## 2015-10-17 MED ORDER — CETYLPYRIDINIUM CHLORIDE 0.05 % MT LIQD
7.0000 mL | Freq: Two times a day (BID) | OROMUCOSAL | Status: DC
  Administered 2015-10-17 – 2015-10-20 (×7): 7 mL via OROMUCOSAL

## 2015-10-17 MED ORDER — IPRATROPIUM-ALBUTEROL 0.5-2.5 (3) MG/3ML IN SOLN
3.0000 mL | RESPIRATORY_TRACT | Status: DC | PRN
  Filled 2015-10-17: qty 3

## 2015-10-17 MED ORDER — POTASSIUM CHLORIDE 10 MEQ/50ML IV SOLN
10.0000 meq | INTRAVENOUS | Status: AC
  Administered 2015-10-17 (×3): 10 meq via INTRAVENOUS
  Filled 2015-10-17: qty 50

## 2015-10-17 MED ORDER — ASPIRIN EC 81 MG PO TBEC
81.0000 mg | DELAYED_RELEASE_TABLET | Freq: Every day | ORAL | Status: DC
  Administered 2015-10-17 – 2015-10-21 (×5): 81 mg via ORAL
  Filled 2015-10-17 (×5): qty 1

## 2015-10-17 MED ORDER — PIPERACILLIN-TAZOBACTAM 3.375 G IVPB
3.3750 g | Freq: Three times a day (TID) | INTRAVENOUS | Status: DC
  Administered 2015-10-17 – 2015-10-21 (×13): 3.375 g via INTRAVENOUS
  Filled 2015-10-17 (×20): qty 50

## 2015-10-17 MED ORDER — POTASSIUM CHLORIDE 10 MEQ/50ML IV SOLN
10.0000 meq | INTRAVENOUS | Status: AC
  Administered 2015-10-17 (×3): 10 meq via INTRAVENOUS
  Filled 2015-10-17 (×4): qty 50

## 2015-10-17 MED FILL — Sodium Chloride IV Soln 0.9%: INTRAVENOUS | Qty: 2000 | Status: AC

## 2015-10-17 MED FILL — Heparin Sodium (Porcine) Inj 1000 Unit/ML: INTRAMUSCULAR | Qty: 30 | Status: AC
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
## 2015-10-18 ENCOUNTER — Encounter (HOSPITAL_COMMUNITY): Payer: Self-pay | Admitting: Vascular Surgery

## 2015-10-18 ENCOUNTER — Inpatient Hospital Stay (HOSPITAL_COMMUNITY): Payer: No Typology Code available for payment source

## 2015-10-18 LAB — TYPE AND SCREEN
ABO/RH(D): A POS
Antibody Screen: NEGATIVE
UNIT DIVISION: 0
UNIT DIVISION: 0
UNIT DIVISION: 0
UNIT DIVISION: 0
UNIT DIVISION: 0
UNIT DIVISION: 0
UNIT DIVISION: 0
UNIT DIVISION: 0
UNIT DIVISION: 0
UNIT DIVISION: 0
UNIT DIVISION: 0
UNIT DIVISION: 0
UNIT DIVISION: 0
UNIT DIVISION: 0
UNIT DIVISION: 0
Unit division: 0
Unit division: 0
Unit division: 0
Unit division: 0
Unit division: 0
Unit division: 0
Unit division: 0
Unit division: 0
Unit division: 0
Unit division: 0
Unit division: 0
Unit division: 0
Unit division: 0

## 2015-10-18 LAB — BASIC METABOLIC PANEL
Anion gap: 6 (ref 5–15)
CHLORIDE: 103 mmol/L (ref 101–111)
CO2: 29 mmol/L (ref 22–32)
Calcium: 8.4 mg/dL — ABNORMAL LOW (ref 8.9–10.3)
Creatinine, Ser: 1.05 mg/dL (ref 0.61–1.24)
GFR calc Af Amer: >60 mL/min (ref >60)
GFR calc non Af Amer: >60 mL/min (ref >60)
Glucose, Bld: 128 mg/dL — ABNORMAL HIGH (ref 65–99)
POTASSIUM: 3.4 mmol/L — AB (ref 3.5–5.1)
SODIUM: 138 mmol/L (ref 135–145)

## 2015-10-18 LAB — CULTURE, RESPIRATORY W GRAM STAIN: Culture: NORMAL

## 2015-10-18 LAB — CBC
HEMATOCRIT: 30.8 % — AB (ref 39.0–52.0)
HEMOGLOBIN: 10.4 g/dL — AB (ref 13.0–17.0)
MCH: 29.4 pg (ref 26.0–34.0)
MCHC: 33.8 g/dL (ref 30.0–36.0)
MCV: 87 fL (ref 78.0–100.0)
Platelets: 138 10*3/uL — ABNORMAL LOW (ref 150–400)
RBC: 3.54 MIL/uL — AB (ref 4.22–5.81)
RDW: 14.8 % (ref 11.5–15.5)
WBC: 9.5 10*3/uL (ref 4.0–10.5)

## 2015-10-18 MED ORDER — ALTEPLASE 2 MG IJ SOLR
2.0000 mg | Freq: Once | INTRAMUSCULAR | Status: AC
  Administered 2015-10-18: 2 mg
  Filled 2015-10-18: qty 2
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
## 2015-10-18 NOTE — Progress Notes (Signed)
Pt progressing; has orders to transfer to stepdown unit.  Bedside nurse states pt walked around unit today without difficulty.  Pt states he has a family member who is a Engineer, civil (consulting)nurse, who will stay with him at dc.  Will follow for dc needs as pt progresses.    Quintella BatonJulie W. Marisha Renier, RN, BSN  Trauma/Neuro ICU Case Manager [phone]

## 2015-10-19 ENCOUNTER — Inpatient Hospital Stay (HOSPITAL_COMMUNITY): Payer: No Typology Code available for payment source

## 2015-10-19 LAB — BASIC METABOLIC PANEL
Anion gap: 8 (ref 5–15)
BUN: 5 mg/dL — AB (ref 6–20)
CO2: 29 mmol/L (ref 22–32)
CREATININE: 0.92 mg/dL (ref 0.61–1.24)
Calcium: 8.6 mg/dL — ABNORMAL LOW (ref 8.9–10.3)
Chloride: 103 mmol/L (ref 101–111)
Glucose, Bld: 94 mg/dL (ref 65–99)
POTASSIUM: 3.5 mmol/L (ref 3.5–5.1)
SODIUM: 140 mmol/L (ref 135–145)

## 2015-10-19 LAB — CBC WITH DIFFERENTIAL/PLATELET
BASOS PCT: 0 %
Basophils Absolute: 0 10*3/uL (ref 0.0–0.1)
EOS PCT: 5 %
Eosinophils Absolute: 0.4 10*3/uL (ref 0.0–0.7)
HCT: 30.5 % — ABNORMAL LOW (ref 39.0–52.0)
HEMOGLOBIN: 10.6 g/dL — AB (ref 13.0–17.0)
Lymphocytes Relative: 26 %
Lymphs Abs: 2.4 10*3/uL (ref 0.7–4.0)
MCH: 31.1 pg (ref 26.0–34.0)
MCHC: 34.8 g/dL (ref 30.0–36.0)
MCV: 89.4 fL (ref 78.0–100.0)
MONO ABS: 0.6 10*3/uL (ref 0.1–1.0)
Monocytes Relative: 6 %
Neutro Abs: 5.8 10*3/uL (ref 1.7–7.7)
Neutrophils Relative %: 63 %
PLATELETS: 184 10*3/uL (ref 150–400)
RBC: 3.41 MIL/uL — AB (ref 4.22–5.81)
RDW: 14.7 % (ref 11.5–15.5)
WBC: 9.2 10*3/uL (ref 4.0–10.5)

## 2015-10-19 MED ORDER — PANTOPRAZOLE SODIUM 40 MG PO TBEC
40.0000 mg | DELAYED_RELEASE_TABLET | Freq: Two times a day (BID) | ORAL | Status: DC
  Administered 2015-10-19 – 2015-10-20 (×4): 40 mg via ORAL
  Filled 2015-10-19 (×3): qty 1

## 2015-10-19 MED ORDER — ENOXAPARIN SODIUM 40 MG/0.4ML ~~LOC~~ SOLN
40.0000 mg | SUBCUTANEOUS | Status: DC
  Administered 2015-10-19 – 2015-10-20 (×2): 40 mg via SUBCUTANEOUS
  Filled 2015-10-19 (×2): qty 0.4
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
## 2015-10-20 ENCOUNTER — Inpatient Hospital Stay (HOSPITAL_COMMUNITY): Payer: No Typology Code available for payment source

## 2015-10-20 LAB — CBC
HCT: 32.4 % — ABNORMAL LOW (ref 39.0–52.0)
Hemoglobin: 10.8 g/dL — ABNORMAL LOW (ref 13.0–17.0)
MCH: 29.8 pg (ref 26.0–34.0)
MCHC: 33.3 g/dL (ref 30.0–36.0)
MCV: 89.3 fL (ref 78.0–100.0)
PLATELETS: 273 10*3/uL (ref 150–400)
RBC: 3.63 MIL/uL — ABNORMAL LOW (ref 4.22–5.81)
RDW: 14.2 % (ref 11.5–15.5)
WBC: 10.6 10*3/uL — AB (ref 4.0–10.5)

## 2015-10-20 IMAGING — RF DG SWALLOWING FUNCTION - NRPT [facility]
1 series · 18 of 24 positions shown · non-contrast
Comparison: none

[Series 1: run · 12 acquisitions, 18 frames shown]
[im 1/12]
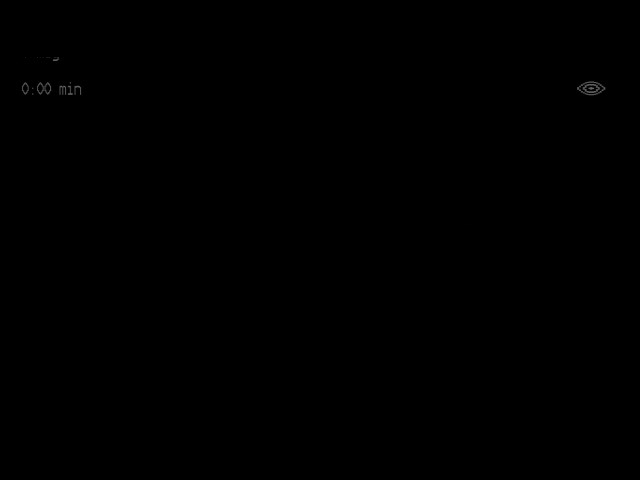
[im 2/12]
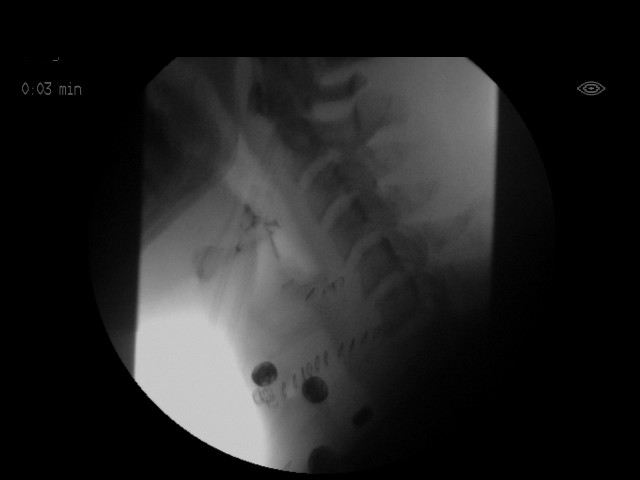
[im 2/12]
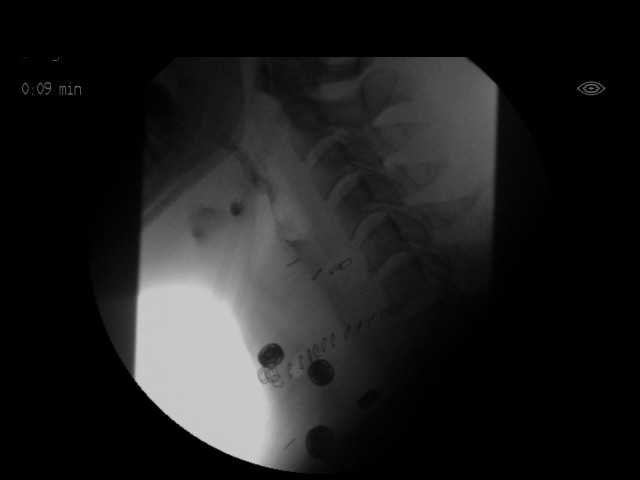
[im 3/12]
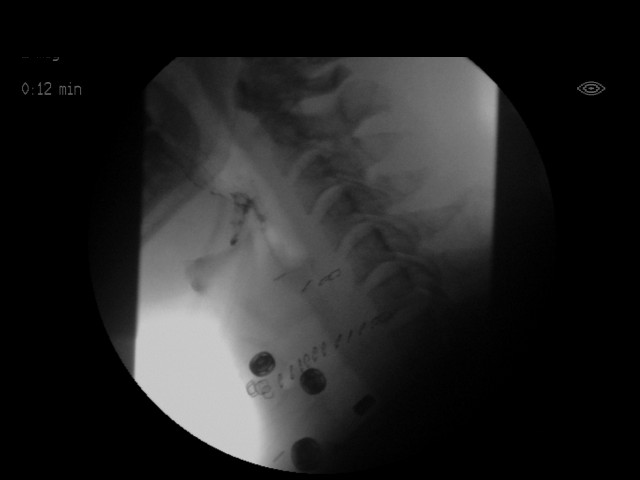
[im 4/12]
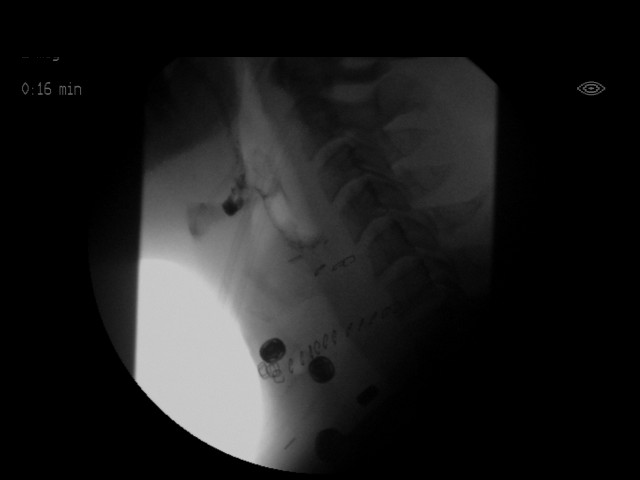
[im 4/12]
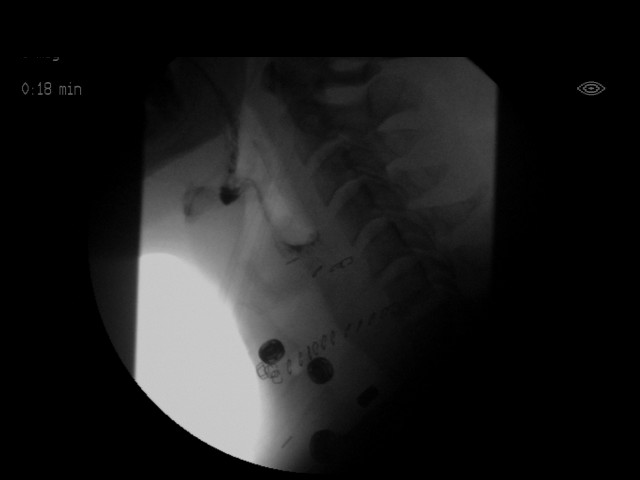
[im 5/12]
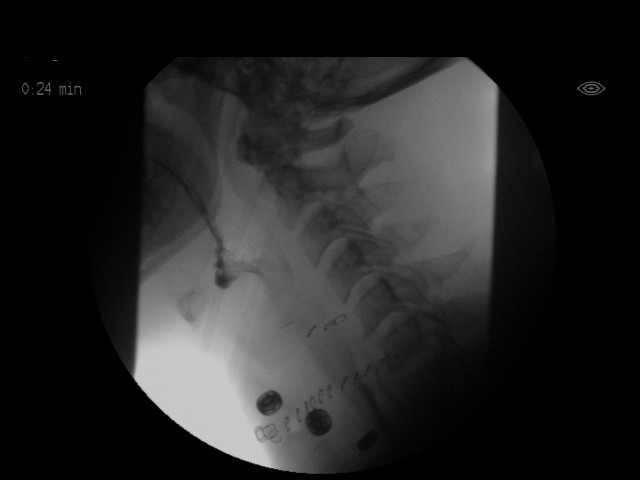
[im 6/12]
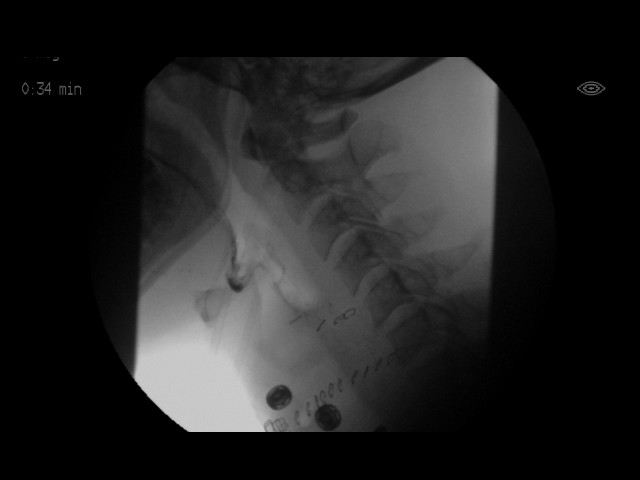
[im 6/12]
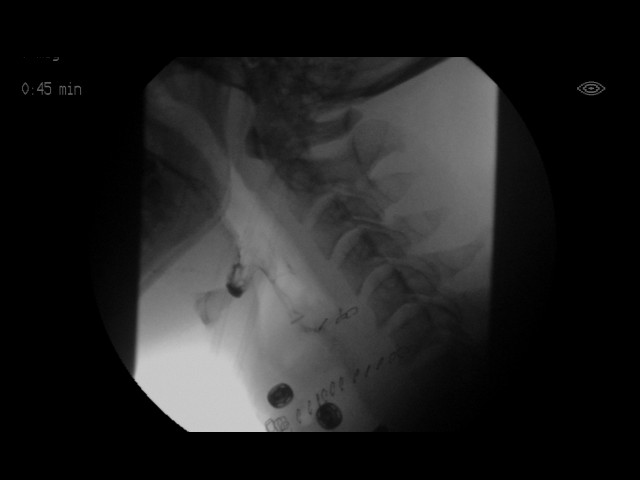
[im 7/12]
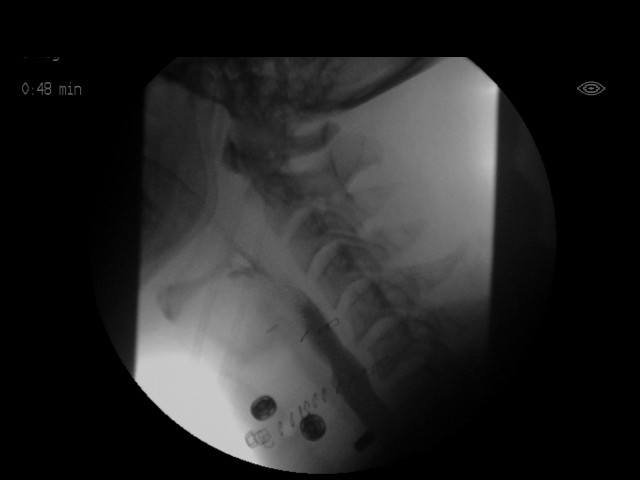
[im 8/12]
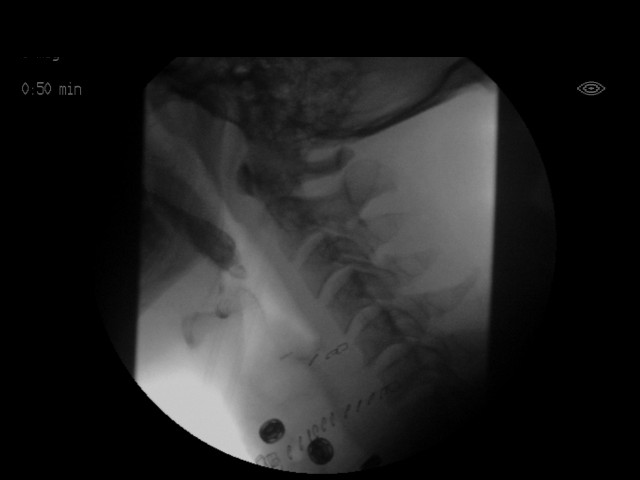
[im 8/12]
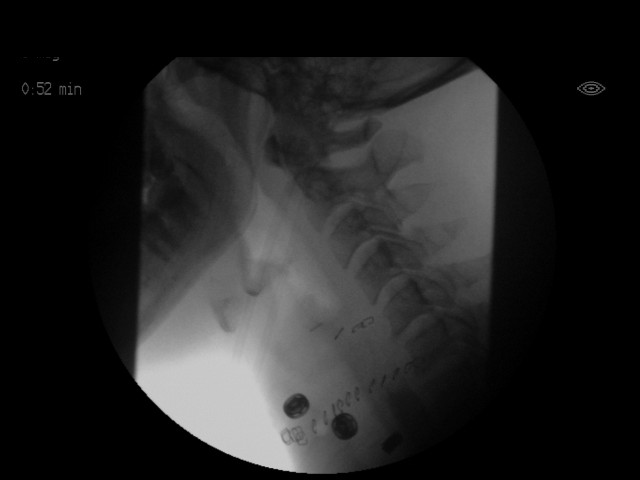
[im 9/12]
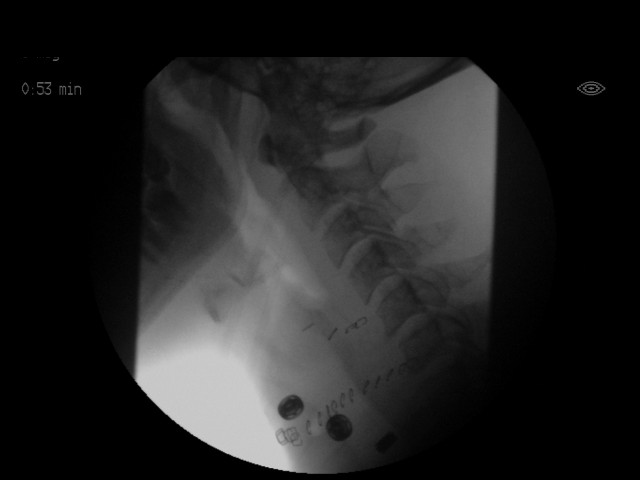
[im 10/12]
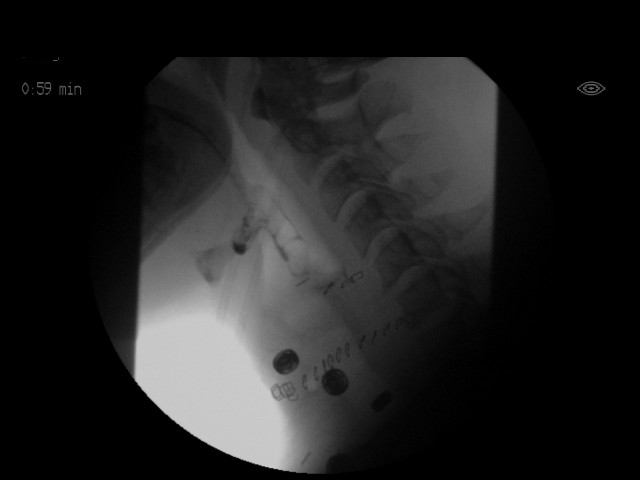
[im 10/12]
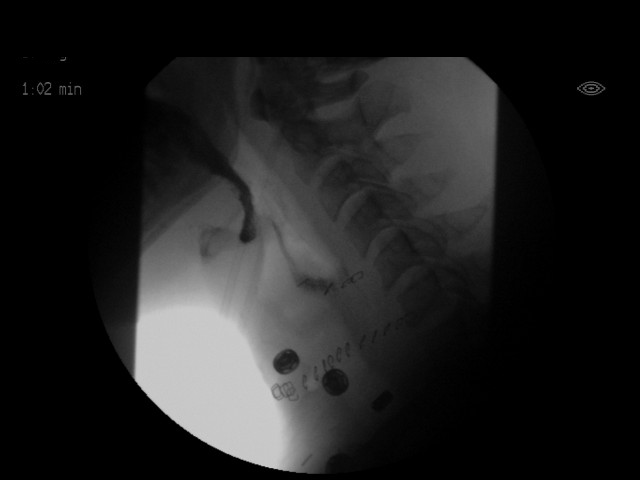
[im 11/12]
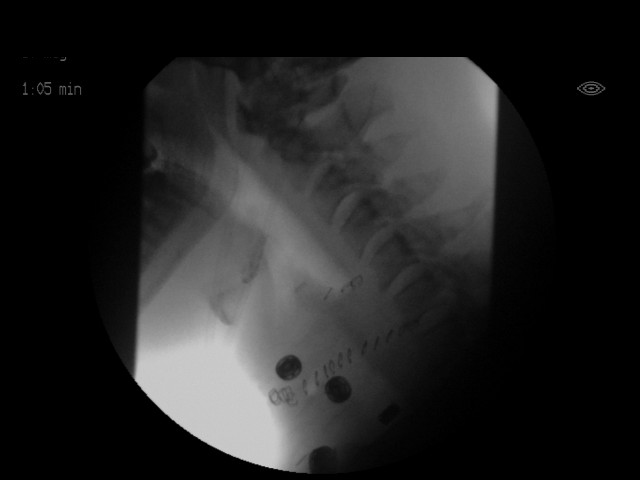
[im 12/12]
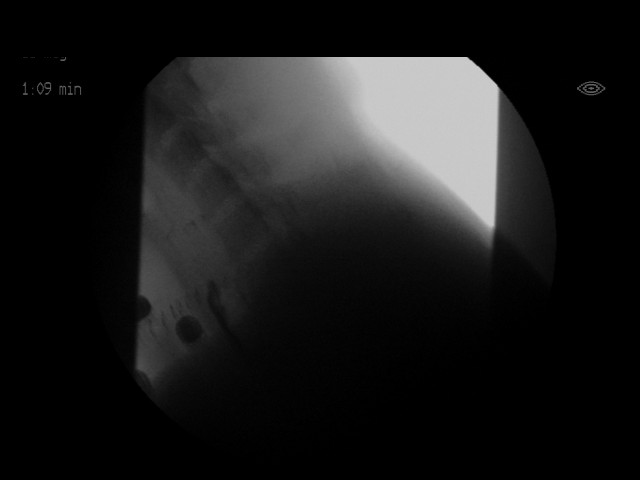
[im 12/12]
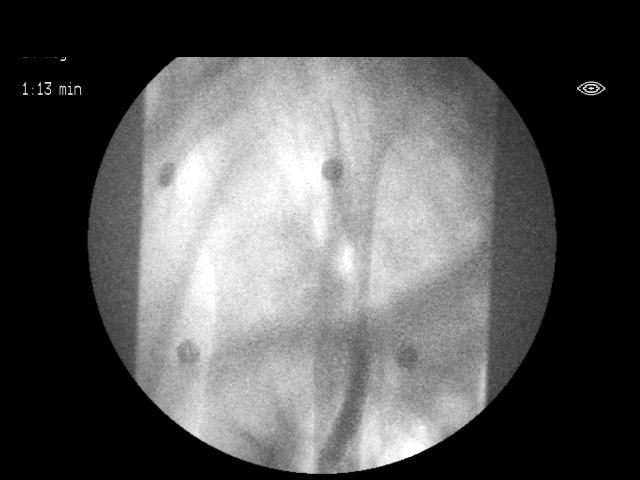

[18 of 24 positions shown; findings below may reference images not displayed]

FLUOROSCOPY FOR SWALLOWING FUNCTION STUDY:
Fluoroscopy was provided for swallowing function study, which was administered by a speech pathologist.  Final results and recommendations from this study are contained within the speech pathology report.

## 2015-10-20 MED ORDER — GUAIFENESIN ER 600 MG PO TB12
600.0000 mg | ORAL_TABLET | Freq: Two times a day (BID) | ORAL | Status: DC
  Administered 2015-10-20: 600 mg via ORAL
  Filled 2015-10-20 (×2): qty 1
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
## 2015-10-20 NOTE — Progress Notes (Signed)
Trauma Service Note  Subjective: Patient mostly sleepy.  No new complaints.  Objective: Vital signs in last 24 hours: Temp:  [98.3 F (36.8 C)-100 F (37.8 C)] 98.3 F (36.8 C) (01/06 0526) Pulse Rate:  [84-94] 94 (01/06 0526) Resp:  [13-18] 18 (01/06 0526) BP: (118-134)/(79-86) 134/84 mmHg (01/06 0526) SpO2:  [93 %-100 %] 93 % (01/06 0526) Last BM Date: 10/19/15  Intake/Output from previous day: 01/05 0701 - 01/06 0700 In: 120 [P.O.:120] Out: -  Intake/Output this shift:    General: No distress.  Lungs: Clear  Abd: BEnign  Extremities: Still some numbness and tingling in left hand, mostly the ulnar aspect  Neuro: Intact  Lab Results: CBC   Recent Labs  10/19/15 0345 10/20/15 0442  WBC 9.2 10.6*  HGB 10.6* 10.8*  HCT 30.5* 32.4*  PLT 184 273   BMET  Recent Labs  10/18/15 0425 10/19/15 0345  NA 138 140  K 3.4* 3.5  CL 103 103  CO2 29 29  GLUCOSE 128* 94  BUN <5* 5*  CREATININE 1.05 0.92  CALCIUM 8.4* 8.6*   PT/INR No results for input(s): LABPROT, INR in the last 72 hours. ABG No results for input(s): PHART, HCO3 in the last 72 hours.  Invalid input(s): PCO2, PO2  Studies/Results: Dg Chest 2 View  10/19/2015  CLINICAL DATA:  Shortness of breath. EXAM: CHEST  2 VIEW COMPARISON:  10/18/2015 . FINDINGS: Left IJ line in stable position with tip at the level of the lower left IJ vein. Right PICC line in stable position with tip at cavoatrial junction. Interim removal of mediastinal drainage catheter. Prior median sternotomy. Improving left lung infiltrate. Mild right base subsegmental atelectasis Improving small left pleural effusion. Small right pleural effusion cannot be excluded. No pneumothorax. Surgical clips at the base of neck. No acute osseous abnormality. IMPRESSION: 1. Interim removal of mediastinal drainage catheter. Left IJ line and PICC line in stable position. 2. Interim improvement of left lung infiltrate and left pleural effusion. Mild  right base subsegmental atelectasis. Small right pleural effusion cannot be excluded. No pneumothorax. 3. Median sternotomy.  Heart size stable . Electronically Signed   By: Maisie Fushomas  Register   On: 10/19/2015 07:29    Anti-infectives: Anti-infectives    Start     Dose/Rate Route Frequency Ordered Stop   10/17/15 0800  piperacillin-tazobactam (ZOSYN) IVPB 3.375 g     3.375 g 12.5 mL/hr over 240 Minutes Intravenous 3 times per day 10/17/15 0749     10/15/15 1800  vancomycin (VANCOCIN) IVPB 1000 mg/200 mL premix     1,000 mg 200 mL/hr over 60 Minutes Intravenous Every 12 hours 10/15/15 1007 10/16/15 1856   10/15/15 1500  cefUROXime (ZINACEF) 1.5 g in dextrose 5 % 50 mL IVPB     1.5 g 100 mL/hr over 30 Minutes Intravenous Every 12 hours 10/15/15 0808 10/17/15 0240   10/15/15 0611  vancomycin (VANCOCIN) powder  Status:  Discontinued       As needed 10/15/15 0616 10/15/15 0742      Assessment/Plan: s/p Procedure(s): LEFT NECK EXPLORATION STERNOTOMY CHEST EXPLORATION WITH REPAIR OF LEFT INTERANL CAROTID ARTERY Discharge From our perspective the patient can be discharged to home.  LOS: 5 days   Marta LamasJames O. Gae BonWyatt, III, MD, FACS [phone] Trauma Surgeon 10/20/2015
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
## 2015-10-21 MED ORDER — OXYCODONE-ACETAMINOPHEN 5-325 MG PO TABS: 1.0000 | ORAL_TABLET | ORAL | Status: AC | PRN

## 2015-10-21 MED ORDER — ASPIRIN 81 MG PO TBEC: 81.0000 mg | DELAYED_RELEASE_TABLET | Freq: Every day | ORAL | Status: AC
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
## 2015-10-21 NOTE — Discharge Instructions (Signed)
GENERAL SURGERY: POST OP INSTRUCTIONS ° °1. DIET: Follow a light bland diet the first 24 hours after arrival home, such as soup, liquids, crackers, etc.  Be sure to include lots of fluids daily.  Avoid fast food or heavy meals as your are more likely to get nauseated.   °2. Take your usually prescribed home medications unless otherwise directed. °3. PAIN CONTROL: °a. Pain is best controlled by a usual combination of three different methods TOGETHER: °i. Ice/Heat °ii. Over the counter pain medication °iii. Prescription pain medication °b. Most patients will experience some swelling and bruising around the incisions.  Ice packs or heating pads (30-60 minutes up to 6 times a day) will help. Use ice for the first few days to help decrease swelling and bruising, then switch to heat to help relax tight/sore spots and speed recovery.  Some people prefer to use ice alone, heat alone, alternating between ice & heat.  Experiment to what works for you.  Swelling and bruising can take several weeks to resolve.   °c. It is helpful to take an over-the-counter pain medication regularly for the first few weeks.  Choose one of the following that works best for you: °i. Naproxen (Aleve, etc)  Two 220mg tabs twice a day °ii. Ibuprofen (Advil, etc) Three 200mg tabs four times a day (every meal & bedtime) °iii. Acetaminophen (Tylenol, etc) 500-650mg four times a day (every meal & bedtime) °d. A  prescription for pain medication (such as oxycodone, hydrocodone, etc) should be given to you upon discharge.  Take your pain medication as prescribed.  °i. If you are having problems/concerns with the prescription medicine (does not control pain, nausea, vomiting, rash, itching, etc), please call us [phone] to see if we need to switch you to a different pain medicine that will work better for you and/or control your side effect better. °ii. If you need a refill on your pain medication, please contact your pharmacy.  They will contact our  office to request authorization. Prescriptions will not be filled after 5 pm or on week-ends. °4. Avoid getting constipated.  Between the surgery and the pain medications, it is common to experience some constipation.  Increasing fluid intake and taking a fiber supplement (such as Metamucil, Citrucel, FiberCon, MiraLax, etc) 1-2 times a day regularly will usually help prevent this problem from occurring.  A mild laxative (prune juice, Milk of Magnesia, MiraLax, etc) should be taken according to package directions if there are no bowel movements after 48 hours.   °5. Wash / shower every day.  You may shower over the dressings as they are waterproof.  Continue to shower over incision(s) after the dressing is off. °6. Remove your waterproof bandages 5 days after surgery.  You may leave the incision open to air.  You may have skin tapes (Steri Strips) covering the incision(s).  Leave them on until one week, then remove.  You may replace a dressing/Band-Aid to cover the incision for comfort if you wish.  ° ° ° ° °7. ACTIVITIES as tolerated:   °a. You may resume regular (light) daily activities beginning the next day--such as daily self-care, walking, climbing stairs--gradually increasing activities as tolerated.  If you can walk 30 minutes without difficulty, it is safe to try more intense activity such as jogging, treadmill, bicycling, low-impact aerobics, swimming, etc. °b. Save the most intensive and strenuous activity for last such as sit-ups, heavy lifting, contact sports, etc  Refrain from any heavy lifting or straining until you   are off narcotics for pain control.   °c. DO NOT PUSH THROUGH PAIN.  Let pain be your guide: If it hurts to do something, don't do it.  Pain is your body warning you to avoid that activity for another week until the pain goes down. °d. You may drive when you are no longer taking prescription pain medication, you can comfortably wear a seatbelt, and you can safely maneuver your car and  apply brakes. °e. You may have sexual intercourse when it is comfortable.  °8. FOLLOW UP in our office °a. Please call [facility] at [phone] to set up an appointment to see your surgeon in the office for a follow-up appointment approximately 2-3 weeks after your surgery. °b. Make sure that you call for this appointment the day you arrive home to insure a convenient appointment time. °9. IF YOU HAVE DISABILITY OR FAMILY LEAVE FORMS, BRING THEM TO THE OFFICE FOR PROCESSING.  DO NOT GIVE THEM TO YOUR DOCTOR. ° ° °WHEN TO CALL US [phone]: °1. Poor pain control °2. Reactions / problems with new medications (rash/itching, nausea, etc)  °3. Fever over 101.5 F (38.5 C) °4. Worsening swelling or bruising °5. Continued bleeding from incision. °6. Increased pain, redness, or drainage from the incision °7. Difficulty breathing / swallowing ° ° The clinic staff is available to answer your questions during regular business hours (8:30am-5pm).  Please don’t hesitate to call and ask to speak to one of our nurses for clinical concerns.  ° If you have a medical emergency, go to the nearest emergency room or call 911. ° A surgeon from [facility] is always on call at the hospitals ° ° °[facility] °[address], [address], [place], [place]  [address] ? °MAIN: [phone] ? TOLL FREE: [phone] ?  °FAX [phone] °[email or web address] ° °
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
## 2015-10-21 NOTE — Progress Notes (Signed)
Discharged to home with family office visits in place teaching done  

## 2015-10-26 ENCOUNTER — Other Ambulatory Visit: Payer: Self-pay | Admitting: *Deleted

## 2015-10-26 DIAGNOSIS — G8918 Other acute postprocedural pain: Secondary | ICD-10-CM

## 2015-10-26 MED ORDER — TRAMADOL HCL 50 MG PO TABS: 50.0000 mg | ORAL_TABLET | Freq: Four times a day (QID) | ORAL | Status: DC | PRN

## 2015-10-26 NOTE — Telephone Encounter (Signed)
Austin Campos has called requesting a refill for his Oxycodone which was prescribed on discharge by [name], MD. after surgery by [name] and [name].  Since he lives in [place] I offered to fax  script for Ultram as a substitute and he agreed.  He said he had made arrangements to have his staples removed locally and will see [name] on 11/15/15.

## 2015-10-27 ENCOUNTER — Telehealth: Payer: Self-pay | Admitting: Vascular Surgery

## 2015-10-27 ENCOUNTER — Ambulatory Visit: Payer: Self-pay | Admitting: Family

## 2015-10-27 ENCOUNTER — Encounter: Payer: Self-pay | Admitting: Family
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
## 2015-11-03 ENCOUNTER — Encounter: Payer: Self-pay | Admitting: Family

## 2015-11-03 ENCOUNTER — Ambulatory Visit: Payer: Self-pay | Admitting: Family

## 2015-11-06 ENCOUNTER — Encounter: Payer: Self-pay | Admitting: Family

## 2015-11-06 ENCOUNTER — Ambulatory Visit (INDEPENDENT_AMBULATORY_CARE_PROVIDER_SITE_OTHER): Payer: Self-pay | Admitting: Family

## 2015-11-06 VITALS — BP 128/77 | HR 120 | Temp 98.9°F | Resp 18 | Ht 71.0 in | Wt 180.0 lb

## 2015-11-06 DIAGNOSIS — S15022S Major laceration of left carotid artery, sequela: Secondary | ICD-10-CM

## 2015-11-06 DIAGNOSIS — S1191XD Laceration without foreign body of unspecified part of neck, subsequent encounter: Secondary | ICD-10-CM
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
## 2015-11-09 ENCOUNTER — Telehealth: Payer: Self-pay

## 2015-11-09 NOTE — Telephone Encounter (Signed)
Pt. notified to call Dr. Alla German for pain medication.  Pt. hung up on nurse.
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
## 2015-11-10 ENCOUNTER — Other Ambulatory Visit: Payer: Self-pay | Admitting: *Deleted

## 2015-11-10 DIAGNOSIS — G8918 Other acute postprocedural pain: Secondary | ICD-10-CM

## 2015-11-10 MED ORDER — TRAMADOL HCL 50 MG PO TABS: 50.0000 mg | ORAL_TABLET | Freq: Four times a day (QID) | ORAL | Status: DC | PRN

## 2015-11-10 MED ORDER — TRAMADOL HCL 50 MG PO TABS: 50.0000 mg | ORAL_TABLET | Freq: Four times a day (QID) | ORAL | Status: AC | PRN

## 2015-11-10 NOTE — Telephone Encounter (Signed)
Austin Campos has called requesting a refill for pain.  He has a post op follow up appointment next week.  I informed him that I would fax a new signed script for Tramadol to his pharmacy and he understood.

## 2015-11-14 ENCOUNTER — Other Ambulatory Visit: Payer: Self-pay | Admitting: Cardiothoracic Surgery

## 2015-11-14 DIAGNOSIS — S21339D Puncture wound without foreign body of unspecified front wall of thorax with penetration into thoracic cavity, subsequent encounter: Principal | ICD-10-CM

## 2015-11-14 DIAGNOSIS — W3400XD Accidental discharge from unspecified firearms or gun, subsequent encounter: Secondary | ICD-10-CM

## 2015-11-15 ENCOUNTER — Ambulatory Visit: Payer: Self-pay | Admitting: Cardiothoracic Surgery

## 2015-11-17 ENCOUNTER — Encounter: Payer: Self-pay | Admitting: Vascular Surgery

## 2015-11-17 ENCOUNTER — Ambulatory Visit: Payer: Self-pay | Admitting: Cardiothoracic Surgery

## 2015-11-19 ENCOUNTER — Other Ambulatory Visit: Payer: Self-pay | Admitting: Thoracic Surgery (Cardiothoracic Vascular Surgery)

## 2015-11-22 ENCOUNTER — Ambulatory Visit: Payer: Self-pay | Admitting: Cardiothoracic Surgery

## 2015-11-24 ENCOUNTER — Ambulatory Visit: Payer: Self-pay | Admitting: Vascular Surgery

## 2016-04-08 DIAGNOSIS — Z0279 Encounter for issue of other medical certificate: Secondary | ICD-10-CM
